# Patient Record
Sex: Female | Born: 1975 | Race: White | Hispanic: No | Marital: Married | Smoking: Never smoker
Health system: Southern US, Community
[De-identification: ages and names within clinical notes are randomized; demographics above are authoritative.]

## PROBLEM LIST (undated history)

## (undated) DIAGNOSIS — Z9889 Other specified postprocedural states: Secondary | ICD-10-CM

## (undated) DIAGNOSIS — I10 Essential (primary) hypertension: Secondary | ICD-10-CM

## (undated) DIAGNOSIS — I639 Cerebral infarction, unspecified: Secondary | ICD-10-CM

## (undated) DIAGNOSIS — I6381 Other cerebral infarction due to occlusion or stenosis of small artery: Secondary | ICD-10-CM

## (undated) DIAGNOSIS — K589 Irritable bowel syndrome without diarrhea: Secondary | ICD-10-CM

## (undated) DIAGNOSIS — J45909 Unspecified asthma, uncomplicated: Secondary | ICD-10-CM

## (undated) DIAGNOSIS — G43909 Migraine, unspecified, not intractable, without status migrainosus: Secondary | ICD-10-CM

## (undated) DIAGNOSIS — R112 Nausea with vomiting, unspecified: Secondary | ICD-10-CM

## (undated) HISTORY — DX: Cerebral infarction, unspecified: I63.9

## (undated) HISTORY — DX: Migraine, unspecified, not intractable, without status migrainosus: G43.909

## (undated) HISTORY — PX: TUBAL LIGATION: SHX77

## (undated) HISTORY — PX: CHOLECYSTECTOMY: SHX55

## (undated) HISTORY — PX: HERNIA REPAIR: SHX51

## (undated) HISTORY — DX: Other cerebral infarction due to occlusion or stenosis of small artery: I63.81

---
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
## 2000-05-09 ENCOUNTER — Other Ambulatory Visit: Admission: RE | Admit: 2000-05-09 | Discharge: 2000-05-09 | Payer: Self-pay | Admitting: Obstetrics

## 2000-07-15 ENCOUNTER — Inpatient Hospital Stay (HOSPITAL_COMMUNITY): Admission: AD | Admit: 2000-07-15 | Discharge: 2000-07-15 | Payer: Self-pay | Admitting: Obstetrics

## 2000-08-30 ENCOUNTER — Inpatient Hospital Stay (HOSPITAL_COMMUNITY): Admission: AD | Admit: 2000-08-30 | Discharge: 2000-08-30 | Payer: Self-pay | Admitting: Obstetrics

## 2000-09-17 ENCOUNTER — Inpatient Hospital Stay (HOSPITAL_COMMUNITY): Admission: AD | Admit: 2000-09-17 | Discharge: 2000-09-17 | Payer: Self-pay | Admitting: Obstetrics

## 2000-10-12 ENCOUNTER — Inpatient Hospital Stay (HOSPITAL_COMMUNITY): Admission: AD | Admit: 2000-10-12 | Discharge: 2000-10-12 | Payer: Self-pay | Admitting: Obstetrics

## 2000-10-18 ENCOUNTER — Inpatient Hospital Stay (HOSPITAL_COMMUNITY): Admission: AD | Admit: 2000-10-18 | Discharge: 2000-10-18 | Payer: Self-pay | Admitting: Obstetrics

## 2000-10-19 ENCOUNTER — Ambulatory Visit (HOSPITAL_COMMUNITY): Admission: RE | Admit: 2000-10-19 | Discharge: 2000-10-19 | Payer: Self-pay | Admitting: Obstetrics

## 2000-10-19 ENCOUNTER — Encounter: Payer: Self-pay | Admitting: Obstetrics

## 2000-11-12 ENCOUNTER — Inpatient Hospital Stay (HOSPITAL_COMMUNITY): Admission: AD | Admit: 2000-11-12 | Discharge: 2000-11-16 | Payer: Self-pay | Admitting: Obstetrics

## 2002-08-14 HISTORY — PX: HERNIA REPAIR: SHX51

## 2021-05-20 DIAGNOSIS — M25551 Pain in right hip: Secondary | ICD-10-CM | POA: Diagnosis not present

## 2021-05-20 DIAGNOSIS — M129 Arthropathy, unspecified: Secondary | ICD-10-CM | POA: Diagnosis not present

## 2021-05-20 DIAGNOSIS — Z0000 Encounter for general adult medical examination without abnormal findings: Secondary | ICD-10-CM | POA: Diagnosis not present

## 2021-05-20 DIAGNOSIS — R5383 Other fatigue: Secondary | ICD-10-CM | POA: Diagnosis not present

## 2021-05-20 DIAGNOSIS — G8929 Other chronic pain: Secondary | ICD-10-CM | POA: Diagnosis not present

## 2021-05-20 DIAGNOSIS — I10 Essential (primary) hypertension: Secondary | ICD-10-CM | POA: Diagnosis not present

## 2021-06-25 DIAGNOSIS — E7800 Pure hypercholesterolemia, unspecified: Secondary | ICD-10-CM | POA: Diagnosis not present

## 2021-06-25 DIAGNOSIS — I10 Essential (primary) hypertension: Secondary | ICD-10-CM | POA: Diagnosis not present

## 2021-06-25 DIAGNOSIS — Z79899 Other long term (current) drug therapy: Secondary | ICD-10-CM | POA: Diagnosis not present

## 2021-06-25 DIAGNOSIS — E559 Vitamin D deficiency, unspecified: Secondary | ICD-10-CM | POA: Diagnosis not present

## 2021-06-25 DIAGNOSIS — Z6836 Body mass index (BMI) 36.0-36.9, adult: Secondary | ICD-10-CM | POA: Diagnosis not present

## 2021-06-25 DIAGNOSIS — M25551 Pain in right hip: Secondary | ICD-10-CM | POA: Diagnosis not present

## 2021-06-29 DIAGNOSIS — Z79899 Other long term (current) drug therapy: Secondary | ICD-10-CM | POA: Diagnosis not present

## 2021-08-18 DIAGNOSIS — E559 Vitamin D deficiency, unspecified: Secondary | ICD-10-CM

## 2021-08-18 DIAGNOSIS — F418 Other specified anxiety disorders: Secondary | ICD-10-CM | POA: Insufficient documentation

## 2021-08-18 DIAGNOSIS — I10 Essential (primary) hypertension: Secondary | ICD-10-CM

## 2021-08-18 DIAGNOSIS — F419 Anxiety disorder, unspecified: Secondary | ICD-10-CM | POA: Insufficient documentation

## 2021-08-18 DIAGNOSIS — J45909 Unspecified asthma, uncomplicated: Secondary | ICD-10-CM | POA: Insufficient documentation

## 2021-08-18 HISTORY — DX: Essential (primary) hypertension: I10

## 2021-08-18 HISTORY — DX: Vitamin D deficiency, unspecified: E55.9

## 2021-08-18 HISTORY — DX: Other specified anxiety disorders: F41.8

## 2021-08-19 ENCOUNTER — Other Ambulatory Visit: Payer: Self-pay | Admitting: Physician Assistant

## 2021-08-19 DIAGNOSIS — Z9889 Other specified postprocedural states: Secondary | ICD-10-CM

## 2021-10-21 ENCOUNTER — Ambulatory Visit
Admission: RE | Admit: 2021-10-21 | Discharge: 2021-10-21 | Disposition: A | Discharge status: Home / Self Care | Payer: BC Managed Care – PPO | Source: Ambulatory Visit | Attending: Physician Assistant | Admitting: Physician Assistant

## 2021-10-21 DIAGNOSIS — R1030 Lower abdominal pain, unspecified: Secondary | ICD-10-CM | POA: Diagnosis not present

## 2021-10-21 DIAGNOSIS — Z9889 Other specified postprocedural states: Secondary | ICD-10-CM

## 2021-10-21 DIAGNOSIS — I700 Atherosclerosis of aorta: Secondary | ICD-10-CM | POA: Diagnosis not present

## 2021-11-16 DIAGNOSIS — R1031 Right lower quadrant pain: Secondary | ICD-10-CM | POA: Diagnosis not present

## 2021-11-23 DIAGNOSIS — N854 Malposition of uterus: Secondary | ICD-10-CM | POA: Diagnosis not present

## 2021-11-23 DIAGNOSIS — Z888 Allergy status to other drugs, medicaments and biological substances status: Secondary | ICD-10-CM | POA: Diagnosis not present

## 2021-11-23 DIAGNOSIS — Z87891 Personal history of nicotine dependence: Secondary | ICD-10-CM | POA: Diagnosis not present

## 2021-11-23 DIAGNOSIS — N83202 Unspecified ovarian cyst, left side: Secondary | ICD-10-CM | POA: Diagnosis not present

## 2021-11-23 DIAGNOSIS — R1031 Right lower quadrant pain: Secondary | ICD-10-CM | POA: Diagnosis not present

## 2021-11-23 DIAGNOSIS — N838 Other noninflammatory disorders of ovary, fallopian tube and broad ligament: Secondary | ICD-10-CM | POA: Diagnosis not present

## 2021-11-23 DIAGNOSIS — R102 Pelvic and perineal pain: Secondary | ICD-10-CM | POA: Diagnosis not present

## 2021-11-23 DIAGNOSIS — Z881 Allergy status to other antibiotic agents status: Secondary | ICD-10-CM | POA: Diagnosis not present

## 2021-11-24 DIAGNOSIS — R102 Pelvic and perineal pain: Secondary | ICD-10-CM | POA: Diagnosis not present

## 2021-11-24 DIAGNOSIS — R1900 Intra-abdominal and pelvic swelling, mass and lump, unspecified site: Secondary | ICD-10-CM | POA: Diagnosis not present

## 2021-12-12 DIAGNOSIS — N838 Other noninflammatory disorders of ovary, fallopian tube and broad ligament: Secondary | ICD-10-CM | POA: Diagnosis not present

## 2021-12-12 DIAGNOSIS — R1900 Intra-abdominal and pelvic swelling, mass and lump, unspecified site: Secondary | ICD-10-CM | POA: Diagnosis not present

## 2021-12-28 ENCOUNTER — Ambulatory Visit: Payer: Self-pay | Admitting: Surgery

## 2021-12-28 DIAGNOSIS — Z6835 Body mass index (BMI) 35.0-35.9, adult: Secondary | ICD-10-CM

## 2021-12-28 DIAGNOSIS — R1031 Right lower quadrant pain: Secondary | ICD-10-CM | POA: Diagnosis not present

## 2021-12-28 HISTORY — DX: Right lower quadrant pain: R10.31

## 2021-12-28 HISTORY — DX: Body mass index (BMI) 35.0-35.9, adult: Z68.35

## 2022-01-10 DIAGNOSIS — N83202 Unspecified ovarian cyst, left side: Secondary | ICD-10-CM | POA: Diagnosis not present

## 2022-01-10 DIAGNOSIS — N9489 Other specified conditions associated with female genital organs and menstrual cycle: Secondary | ICD-10-CM | POA: Diagnosis not present

## 2022-01-31 ENCOUNTER — Ambulatory Visit: Payer: Self-pay | Admitting: Surgery

## 2022-02-15 ENCOUNTER — Other Ambulatory Visit: Payer: Self-pay

## 2022-02-15 ENCOUNTER — Encounter (HOSPITAL_BASED_OUTPATIENT_CLINIC_OR_DEPARTMENT_OTHER): Payer: Self-pay | Admitting: Surgery

## 2022-02-24 ENCOUNTER — Ambulatory Visit (HOSPITAL_BASED_OUTPATIENT_CLINIC_OR_DEPARTMENT_OTHER): Payer: BC Managed Care – PPO | Admitting: Anesthesiology

## 2022-02-24 ENCOUNTER — Encounter (HOSPITAL_BASED_OUTPATIENT_CLINIC_OR_DEPARTMENT_OTHER): Payer: Self-pay | Admitting: Surgery

## 2022-02-24 ENCOUNTER — Other Ambulatory Visit: Payer: Self-pay

## 2022-02-24 ENCOUNTER — Encounter (HOSPITAL_BASED_OUTPATIENT_CLINIC_OR_DEPARTMENT_OTHER)
Admission: RE | Disposition: A | Discharge status: Home / Self Care | Payer: Self-pay | Source: Home / Self Care | Attending: Surgery

## 2022-02-24 ENCOUNTER — Ambulatory Visit (HOSPITAL_BASED_OUTPATIENT_CLINIC_OR_DEPARTMENT_OTHER)
Admission: RE | Admit: 2022-02-24 | Discharge: 2022-02-24 | Disposition: A | Discharge status: Home / Self Care | Payer: BC Managed Care – PPO | Attending: Surgery | Admitting: Surgery

## 2022-02-24 DIAGNOSIS — J45909 Unspecified asthma, uncomplicated: Secondary | ICD-10-CM | POA: Diagnosis not present

## 2022-02-24 DIAGNOSIS — Z01818 Encounter for other preprocedural examination: Secondary | ICD-10-CM

## 2022-02-24 DIAGNOSIS — R1031 Right lower quadrant pain: Secondary | ICD-10-CM | POA: Diagnosis not present

## 2022-02-24 DIAGNOSIS — Z87891 Personal history of nicotine dependence: Secondary | ICD-10-CM | POA: Diagnosis not present

## 2022-02-24 DIAGNOSIS — G8929 Other chronic pain: Secondary | ICD-10-CM | POA: Diagnosis not present

## 2022-02-24 DIAGNOSIS — K4090 Unilateral inguinal hernia, without obstruction or gangrene, not specified as recurrent: Secondary | ICD-10-CM | POA: Insufficient documentation

## 2022-02-24 DIAGNOSIS — K4190 Unilateral femoral hernia, without obstruction or gangrene, not specified as recurrent: Secondary | ICD-10-CM | POA: Diagnosis not present

## 2022-02-24 DIAGNOSIS — R413 Other amnesia: Secondary | ICD-10-CM | POA: Insufficient documentation

## 2022-02-24 DIAGNOSIS — Z79899 Other long term (current) drug therapy: Secondary | ICD-10-CM | POA: Diagnosis not present

## 2022-02-24 DIAGNOSIS — I10 Essential (primary) hypertension: Secondary | ICD-10-CM | POA: Insufficient documentation

## 2022-02-24 DIAGNOSIS — R4189 Other symptoms and signs involving cognitive functions and awareness: Secondary | ICD-10-CM | POA: Insufficient documentation

## 2022-02-24 DIAGNOSIS — R102 Pelvic and perineal pain: Secondary | ICD-10-CM | POA: Diagnosis not present

## 2022-02-24 DIAGNOSIS — R1030 Lower abdominal pain, unspecified: Secondary | ICD-10-CM | POA: Diagnosis not present

## 2022-02-24 HISTORY — PX: LAPAROSCOPY: SHX197

## 2022-02-24 HISTORY — DX: Nausea with vomiting, unspecified: R11.2

## 2022-02-24 HISTORY — DX: Essential (primary) hypertension: I10

## 2022-02-24 HISTORY — DX: Other amnesia: R41.3

## 2022-02-24 HISTORY — PX: INGUINAL HERNIA REPAIR: SHX194

## 2022-02-24 HISTORY — DX: Irritable bowel syndrome, unspecified: K58.9

## 2022-02-24 HISTORY — DX: Other specified postprocedural states: Z98.890

## 2022-02-24 HISTORY — DX: Unspecified asthma, uncomplicated: J45.909

## 2022-02-24 LAB — POCT I-STAT, CHEM 8
BUN: 13 mg/dL (ref 6–20)
Calcium, Ion: 1.3 mmol/L (ref 1.15–1.40)
Chloride: 97 mmol/L — ABNORMAL LOW (ref 98–111)
Creatinine, Ser: 0.4 mg/dL — ABNORMAL LOW (ref 0.44–1.00)
Glucose, Bld: 134 mg/dL — ABNORMAL HIGH (ref 70–99)
HCT: 44 % (ref 36.0–46.0)
Hemoglobin: 15 g/dL (ref 12.0–15.0)
Potassium: 3.3 mmol/L — ABNORMAL LOW (ref 3.5–5.1)
Sodium: 139 mmol/L (ref 135–145)
TCO2: 25 mmol/L (ref 22–32)

## 2022-02-24 LAB — POCT PREGNANCY, URINE: Preg Test, Ur: NEGATIVE

## 2022-02-24 SURGERY — LAPAROSCOPY, DIAGNOSTIC
Anesthesia: General | Site: Abdomen

## 2022-02-24 MED ORDER — BUPIVACAINE LIPOSOME 1.3 % IJ SUSP
INTRAMUSCULAR | Status: DC | PRN
  Administered 2022-02-24: 20 mL

## 2022-02-24 MED ORDER — OXYCODONE HCL 5 MG PO TABS
5.0000 mg | ORAL_TABLET | Freq: Once | ORAL | Status: AC | PRN
  Administered 2022-02-24: 5 mg via ORAL

## 2022-02-24 MED ORDER — PHENYLEPHRINE 80 MCG/ML (10ML) SYRINGE FOR IV PUSH (FOR BLOOD PRESSURE SUPPORT)
PREFILLED_SYRINGE | INTRAVENOUS | Status: AC
  Filled 2022-02-24: qty 10

## 2022-02-24 MED ORDER — CEFAZOLIN SODIUM-DEXTROSE 2-4 GM/100ML-% IV SOLN
INTRAVENOUS | Status: AC
  Filled 2022-02-24: qty 100

## 2022-02-24 MED ORDER — SODIUM CHLORIDE 0.9 % IV SOLN: 250.0000 mL | INTRAVENOUS | Status: DC | PRN

## 2022-02-24 MED ORDER — PROPOFOL 10 MG/ML IV BOLUS
INTRAVENOUS | Status: AC
  Filled 2022-02-24: qty 20

## 2022-02-24 MED ORDER — SCOPOLAMINE 1 MG/3DAYS TD PT72
MEDICATED_PATCH | TRANSDERMAL | Status: AC
  Filled 2022-02-24: qty 1

## 2022-02-24 MED ORDER — HYDROMORPHONE HCL 1 MG/ML IJ SOLN
0.2500 mg | INTRAMUSCULAR | Status: DC | PRN
  Administered 2022-02-24 (×3): 0.25 mg via INTRAVENOUS

## 2022-02-24 MED ORDER — OXYCODONE HCL 5 MG/5ML PO SOLN: 5.0000 mg | Freq: Once | ORAL | Status: AC | PRN

## 2022-02-24 MED ORDER — DEXAMETHASONE SODIUM PHOSPHATE 10 MG/ML IJ SOLN
INTRAMUSCULAR | Status: AC
  Filled 2022-02-24: qty 1

## 2022-02-24 MED ORDER — 0.9 % SODIUM CHLORIDE (POUR BTL) OPTIME
TOPICAL | Status: DC | PRN
  Administered 2022-02-24: 2000 mL

## 2022-02-24 MED ORDER — CEFAZOLIN SODIUM-DEXTROSE 2-4 GM/100ML-% IV SOLN
2.0000 g | INTRAVENOUS | Status: AC
  Administered 2022-02-24: 2 g via INTRAVENOUS

## 2022-02-24 MED ORDER — SCOPOLAMINE 1 MG/3DAYS TD PT72
1.0000 | MEDICATED_PATCH | TRANSDERMAL | Status: DC
Start: 2022-02-24 — End: 2022-02-24
  Administered 2022-02-24: 1.5 mg via TRANSDERMAL

## 2022-02-24 MED ORDER — LIDOCAINE HCL (PF) 2 % IJ SOLN
INTRAMUSCULAR | Status: AC
  Filled 2022-02-24: qty 5

## 2022-02-24 MED ORDER — ACETAMINOPHEN 500 MG PO TABS
1000.0000 mg | ORAL_TABLET | ORAL | Status: AC
  Administered 2022-02-24: 1000 mg via ORAL

## 2022-02-24 MED ORDER — HYDROMORPHONE HCL 1 MG/ML IJ SOLN
INTRAMUSCULAR | Status: AC
  Filled 2022-02-24: qty 1

## 2022-02-24 MED ORDER — ROCURONIUM BROMIDE 10 MG/ML (PF) SYRINGE
PREFILLED_SYRINGE | INTRAVENOUS | Status: AC
  Filled 2022-02-24: qty 10

## 2022-02-24 MED ORDER — MIDAZOLAM HCL 5 MG/5ML IJ SOLN
INTRAMUSCULAR | Status: DC | PRN
  Administered 2022-02-24 (×2): 1 mg via INTRAVENOUS

## 2022-02-24 MED ORDER — SODIUM CHLORIDE 0.9% FLUSH: 3.0000 mL | INTRAVENOUS | Status: DC | PRN

## 2022-02-24 MED ORDER — BUPIVACAINE LIPOSOME 1.3 % IJ SUSP: 20.0000 mL | Freq: Once | INTRAMUSCULAR | Status: DC

## 2022-02-24 MED ORDER — SUGAMMADEX SODIUM 200 MG/2ML IV SOLN
INTRAVENOUS | Status: DC | PRN
  Administered 2022-02-24: 200 mg via INTRAVENOUS

## 2022-02-24 MED ORDER — CHLORHEXIDINE GLUCONATE CLOTH 2 % EX PADS: 6.0000 | MEDICATED_PAD | Freq: Once | CUTANEOUS | Status: DC

## 2022-02-24 MED ORDER — AMISULPRIDE (ANTIEMETIC) 5 MG/2ML IV SOLN: 10.0000 mg | Freq: Once | INTRAVENOUS | Status: DC | PRN

## 2022-02-24 MED ORDER — FENTANYL CITRATE (PF) 250 MCG/5ML IJ SOLN
INTRAMUSCULAR | Status: AC
  Filled 2022-02-24: qty 5

## 2022-02-24 MED ORDER — LIDOCAINE HCL (CARDIAC) PF 100 MG/5ML IV SOSY
PREFILLED_SYRINGE | INTRAVENOUS | Status: DC | PRN
  Administered 2022-02-24: 100 mg via INTRAVENOUS

## 2022-02-24 MED ORDER — ACETAMINOPHEN 500 MG PO TABS
ORAL_TABLET | ORAL | Status: AC
  Filled 2022-02-24: qty 2

## 2022-02-24 MED ORDER — DEXAMETHASONE SODIUM PHOSPHATE 10 MG/ML IJ SOLN
INTRAMUSCULAR | Status: DC | PRN
  Administered 2022-02-24: 4 mg via INTRAVENOUS

## 2022-02-24 MED ORDER — TRAMADOL HCL 50 MG PO TABS: 50.0000 mg | ORAL_TABLET | Freq: Four times a day (QID) | ORAL | 0 refills | Status: DC | PRN

## 2022-02-24 MED ORDER — MEPERIDINE HCL 25 MG/ML IJ SOLN: 6.2500 mg | INTRAMUSCULAR | Status: DC | PRN

## 2022-02-24 MED ORDER — CELECOXIB 200 MG PO CAPS
ORAL_CAPSULE | ORAL | Status: AC
  Filled 2022-02-24: qty 1

## 2022-02-24 MED ORDER — PROPOFOL 10 MG/ML IV BOLUS
INTRAVENOUS | Status: DC | PRN
  Administered 2022-02-24: 150 mg via INTRAVENOUS

## 2022-02-24 MED ORDER — ONDANSETRON HCL 4 MG/2ML IJ SOLN
INTRAMUSCULAR | Status: AC
  Filled 2022-02-24: qty 2

## 2022-02-24 MED ORDER — FENTANYL CITRATE (PF) 100 MCG/2ML IJ SOLN
INTRAMUSCULAR | Status: DC | PRN
  Administered 2022-02-24 (×2): 25 ug via INTRAVENOUS
  Administered 2022-02-24 (×2): 50 ug via INTRAVENOUS
  Administered 2022-02-24: 25 ug via INTRAVENOUS

## 2022-02-24 MED ORDER — CELECOXIB 200 MG PO CAPS
200.0000 mg | ORAL_CAPSULE | ORAL | Status: AC
  Administered 2022-02-24: 200 mg via ORAL

## 2022-02-24 MED ORDER — PROMETHAZINE HCL 25 MG/ML IJ SOLN: 6.2500 mg | INTRAMUSCULAR | Status: DC | PRN

## 2022-02-24 MED ORDER — ONDANSETRON HCL 4 MG/2ML IJ SOLN
INTRAMUSCULAR | Status: DC | PRN
  Administered 2022-02-24: 4 mg via INTRAVENOUS

## 2022-02-24 MED ORDER — OXYCODONE HCL 5 MG PO TABS
ORAL_TABLET | ORAL | Status: AC
  Filled 2022-02-24: qty 1

## 2022-02-24 MED ORDER — MIDAZOLAM HCL 2 MG/2ML IJ SOLN
INTRAMUSCULAR | Status: AC
  Filled 2022-02-24: qty 2

## 2022-02-24 MED ORDER — LACTATED RINGERS IV SOLN: INTRAVENOUS | Status: DC

## 2022-02-24 MED ORDER — ROCURONIUM BROMIDE 100 MG/10ML IV SOLN
INTRAVENOUS | Status: DC | PRN
  Administered 2022-02-24: 100 mg via INTRAVENOUS
  Administered 2022-02-24 (×2): 20 mg via INTRAVENOUS

## 2022-02-24 MED ORDER — BUPIVACAINE-EPINEPHRINE 0.25% -1:200000 IJ SOLN
INTRAMUSCULAR | Status: DC | PRN
  Administered 2022-02-24: 60 mL

## 2022-02-24 MED ORDER — SODIUM CHLORIDE 0.9 % IR SOLN
Status: DC | PRN
  Administered 2022-02-24: 1000 mL

## 2022-02-24 MED ORDER — METHOCARBAMOL 750 MG PO TABS
750.0000 mg | ORAL_TABLET | Freq: Four times a day (QID) | ORAL | 2 refills | Status: DC | PRN
Start: 2022-02-24 — End: 2022-04-10

## 2022-02-24 MED ORDER — SODIUM CHLORIDE 0.9% FLUSH: 3.0000 mL | Freq: Two times a day (BID) | INTRAVENOUS | Status: DC

## 2022-02-24 MED ORDER — PHENYLEPHRINE HCL (PRESSORS) 10 MG/ML IV SOLN
INTRAVENOUS | Status: DC | PRN
  Administered 2022-02-24: 80 ug via INTRAVENOUS

## 2022-02-24 MED ORDER — ENSURE PRE-SURGERY PO LIQD: 296.0000 mL | Freq: Once | ORAL | Status: DC

## 2022-02-24 SURGICAL SUPPLY — 59 items
APL PRP STRL LF DISP 70% ISPRP (MISCELLANEOUS) ×4
APPLIER CLIP LOGIC TI 5 (MISCELLANEOUS) ×1 IMPLANT
APR CLP MED LRG 33X5 (MISCELLANEOUS) ×2
BLADE SURG 15 STRL LF DISP TIS (BLADE) ×2 IMPLANT
BLADE SURG 15 STRL SS (BLADE) ×3
CABLE HIGH FREQUENCY MONO STRZ (ELECTRODE) ×3 IMPLANT
CHLORAPREP W/TINT 26 (MISCELLANEOUS) ×4 IMPLANT
DECANTER SPIKE VIAL GLASS SM (MISCELLANEOUS) ×3 IMPLANT
DEVICE SECURE STRAP 25 ABSORB (INSTRUMENTS) IMPLANT
DRAPE LAPAROSCOPIC ABDOMINAL (DRAPES) ×3 IMPLANT
DRAPE WARM FLUID 44X44 (DRAPES) ×3 IMPLANT
DRSG TEGADERM 2-3/8X2-3/4 SM (GAUZE/BANDAGES/DRESSINGS) ×6 IMPLANT
DRSG TEGADERM 4X4.75 (GAUZE/BANDAGES/DRESSINGS) ×3 IMPLANT
ELECT REM PT RETURN 9FT ADLT (ELECTROSURGICAL) ×3
ELECTRODE REM PT RTRN 9FT ADLT (ELECTROSURGICAL) ×2 IMPLANT
GAUZE 4X4 16PLY ~~LOC~~+RFID DBL (SPONGE) ×3 IMPLANT
GAUZE SPONGE 4X4 12PLY STRL (GAUZE/BANDAGES/DRESSINGS) ×3 IMPLANT
GLOVE BIO SURGEON STRL SZ7.5 (GLOVE) ×1 IMPLANT
GLOVE BIOGEL PI IND STRL 7.0 (GLOVE) IMPLANT
GLOVE BIOGEL PI IND STRL 8 (GLOVE) ×2 IMPLANT
GLOVE BIOGEL PI INDICATOR 7.0 (GLOVE) ×3
GLOVE BIOGEL PI INDICATOR 8 (GLOVE) ×1
GLOVE ECLIPSE 6.5 STRL STRAW (GLOVE) ×1 IMPLANT
GLOVE ECLIPSE 8.0 STRL XLNG CF (GLOVE) ×3 IMPLANT
GOWN STRL REUS W/TWL LRG LVL3 (GOWN DISPOSABLE) ×2 IMPLANT
GOWN STRL REUS W/TWL XL LVL3 (GOWN DISPOSABLE) ×6 IMPLANT
IRRIG SUCT STRYKERFLOW 2 WTIP (MISCELLANEOUS) ×3
IRRIGATION SUCT STRKRFLW 2 WTP (MISCELLANEOUS) IMPLANT
KIT TURNOVER CYSTO (KITS) ×3 IMPLANT
MANIFOLD NEPTUNE II (INSTRUMENTS) ×1 IMPLANT
MESH BARD SOFT 6X6IN (Mesh General) ×3 IMPLANT
NEEDLE HYPO 22GX1.5 SAFETY (NEEDLE) ×3 IMPLANT
NS IRRIG 500ML POUR BTL (IV SOLUTION) ×3 IMPLANT
PACK BASIN DAY SURGERY FS (CUSTOM PROCEDURE TRAY) ×3 IMPLANT
PAD POSITIONING PINK XL (MISCELLANEOUS) ×3 IMPLANT
PENCIL SMOKE EVACUATOR (MISCELLANEOUS) ×3 IMPLANT
SCISSORS LAP 5X35 DISP (ENDOMECHANICALS) ×3 IMPLANT
SET TUBE SMOKE EVAC HIGH FLOW (TUBING) ×3 IMPLANT
SLEEVE ADV FIXATION 5X100MM (TROCAR) ×4 IMPLANT
SPONGE GAUZE 2X2 8PLY STRL LF (GAUZE/BANDAGES/DRESSINGS) ×9 IMPLANT
SPONGE T-LAP 18X18 ~~LOC~~+RFID (SPONGE) ×3 IMPLANT
SPONGE T-LAP 4X18 ~~LOC~~+RFID (SPONGE) ×2 IMPLANT
SUT MNCRL AB 4-0 PS2 18 (SUTURE) ×3 IMPLANT
SUT PDS AB 1 CT1 27 (SUTURE) ×1 IMPLANT
SUT PROLENE 0 CT 1 30 (SUTURE) ×4 IMPLANT
SUT PROLENE 0 CT 1 CR/8 (SUTURE) ×3 IMPLANT
SUT VIC AB 2-0 SH 27 (SUTURE) ×3
SUT VIC AB 2-0 SH 27XBRD (SUTURE) IMPLANT
SUT VICRYL 0 UR6 27IN ABS (SUTURE) ×1 IMPLANT
SUT VLOC BARB 180 ABS3/0GR12 (SUTURE) ×3
SUTURE VLOC BRB 180 ABS3/0GR12 (SUTURE) IMPLANT
SYR 20ML LL LF (SYRINGE) ×3 IMPLANT
SYR BULB IRRIG 60ML STRL (SYRINGE) ×3 IMPLANT
TOWEL OR 17X26 10 PK STRL BLUE (TOWEL DISPOSABLE) ×6 IMPLANT
TRAY DSU PREP LF (CUSTOM PROCEDURE TRAY) ×3 IMPLANT
TRAY LAPAROSCOPIC (CUSTOM PROCEDURE TRAY) ×3 IMPLANT
TROCAR ADV FIXATION 5X100MM (TROCAR) ×3 IMPLANT
TROCAR XCEL BLUNT TIP 100MML (ENDOMECHANICALS) ×3 IMPLANT
YANKAUER SUCT BULB TIP NO VENT (SUCTIONS) ×3 IMPLANT

## 2022-02-24 NOTE — Discharge Instructions (Addendum)
HERNIA REPAIR: POST OP INSTRUCTIONS  ######################################################################  EAT Gradually transition to a high fiber diet with a fiber supplement over the next few weeks after discharge.  Start with a pureed / full liquid diet (see below)  WALK Walk an hour a day.  Control your pain to do that.    CONTROL PAIN Control pain so that you can walk, sleep, tolerate sneezing/coughing, and go up/down stairs.  HAVE A BOWEL MOVEMENT DAILY Keep your bowels regular to avoid problems.  OK to try a laxative to override constipation.  OK to use an antidairrheal to slow down diarrhea.  Call if not better after 2 tries  CALL IF YOU HAVE PROBLEMS/CONCERNS Call if you are still struggling despite following these instructions. Call if you have concerns not answered by these instructions  ######################################################################    DIET: Follow a light bland diet & liquids the first 24 hours after arrival home, such as soup, liquids, starches, etc.  Be sure to drink plenty of fluids.  Quickly advance to a usual solid diet within a few days.  Avoid fast food or heavy meals as your are more likely to get nauseated or have irregular bowels.  A low-fat, high-fiber diet for the rest of your life is ideal.   Take your usually prescribed home medications unless otherwise directed.  PAIN CONTROL: Pain is best controlled by a usual combination of three different methods TOGETHER: Ice/Heat Over the counter pain medication Prescription pain medication Most patients will experience some swelling and bruising around the hernia(s) such as the bellybutton, groins, or old incisions.  Ice packs or heating pads (30-60 minutes up to 6 times a day) will help. Use ice for the first few days to help decrease swelling and bruising, then switch to heat to help relax tight/sore spots and speed recovery.  Some people prefer to use ice alone, heat alone, alternating  between ice & heat.  Experiment to what works for you.  Swelling and bruising can take several weeks to resolve.   It is helpful to take an over-the-counter pain medication regularly for the first few weeks.  Choose one of the following that works best for you: Naproxen (Aleve, etc)  Two 244m tabs twice a day Ibuprofen (Advil, etc) Three 2045mtabs four times a day (every meal & bedtime) Acetaminophen (Tylenol, etc) 325-65043mour times a day (every meal & bedtime) A  prescription for pain medication should be given to you upon discharge.  Take your pain medication as prescribed.  If you are having problems/concerns with the prescription medicine (does not control pain, nausea, vomiting, rash, itching, etc), please call us [phone] see if we need to switch you to a different pain medicine that will work better for you and/or control your side effect better. If you need a refill on your pain medication, please contact your pharmacy.  They will contact our office to request authorization. Prescriptions will not be filled after 5 pm or on week-ends.  Avoid getting constipated.  Between the surgery and the pain medications, it is common to experience some constipation.  Increasing fluid intake and taking a fiber supplement (such as Metamucil, Citrucel, FiberCon, MiraLax, etc) 1-2 times a day regularly will usually help prevent this problem from occurring.  A mild laxative (prune juice, Milk of Magnesia, MiraLax, etc) should be taken according to package directions if there are no bowel movements after 48 hours.    Wash / shower every day.  You may shower over the dressings  as they are waterproof.    It is good for closed incisions and even open wounds to be washed every day.  Shower every day.  Short baths are fine.  Wash the incisions and wounds clean with soap & water.    You may leave closed incisions open to air if it is dry.   You may cover the incision with clean gauze & replace it after  your daily shower for comfort.  TEGADERM:  You have clear gauze band-aid dressings over your closed incision(s).  Remove the dressings 3 days after surgery.= Monday 7/17    ACTIVITIES as tolerated:   You may resume regular (light) daily activities beginning the next day--such as daily self-care, walking, climbing stairs--gradually increasing activities as tolerated.  Control your pain so that you can walk an hour a day.  If you can walk 30 minutes without difficulty, it is safe to try more intense activity such as jogging, treadmill, bicycling, low-impact aerobics, swimming, etc. Save the most intensive and strenuous activity for last such as sit-ups, heavy lifting, contact sports, etc  Refrain from any heavy lifting or straining until you are off narcotics for pain control.   DO NOT PUSH THROUGH PAIN.  Let pain be your guide: If it hurts to do something, don't do it.  Pain is your body warning you to avoid that activity for another week until the pain goes down. You may drive when you are no longer taking prescription pain medication, you can comfortably wear a seatbelt, and you can safely maneuver your car and apply brakes. You may have sexual intercourse when it is comfortable.   FOLLOW UP in our office Please call [facility] at [phone] to set up an appointment to see your surgeon in the office for a follow-up appointment approximately 2-3 weeks after your surgery. Make sure that you call for this appointment the day you arrive home to insure a convenient appointment time.  9.  If you have disability of FMLA / Family leave forms, please bring the forms to the office for processing.  (do not give to your surgeon).  WHEN TO CALL us [phone]: Poor pain control Reactions / problems with new medications (rash/itching, nausea, etc)  Fever over 101.5 F (38.5 C) Inability to urinate Nausea and/or vomiting Worsening swelling or bruising Continued bleeding from incision. Increased pain,  redness, or drainage from the incision   The clinic staff is available to answer your questions during regular business hours (8:30am-5pm).  Please don't hesitate to call and ask to speak to one of our nurses for clinical concerns.   If you have a medical emergency, go to the nearest emergency room or call 911.  A surgeon from [facility] is always on call at the hospitals in [facility], [place] [address], [address], [place], [place]  [address] ?  [address] [address], [place], [place]   [address] MAIN: [phone] ? TOLL FREE: [phone] ? FAX: [phone] [email or web address]   Information for Discharge Teaching: EXPAREL (bupivacaine liposome injectable suspension)   Your surgeon or anesthesiologist gave you EXPAREL(bupivacaine) to help control your pain after surgery.  EXPAREL is a local anesthetic that provides pain relief by numbing the tissue around the surgical site. EXPAREL is designed to release pain medication over time and can control pain for up to 72 hours. Depending on how you respond to EXPAREL, you may require less pain medication during your recovery.  Possible side effects: Temporary loss  of sensation or ability to move in the area where bupivacaine was injected. Nausea, vomiting, constipation Rarely, numbness and tingling in your mouth or lips, lightheadedness, or anxiety may occur. Call your doctor right away if you think you may be experiencing any of these sensations, or if you have other questions regarding possible side effects.  Follow all other discharge instructions given to you by your surgeon or nurse. Eat a healthy diet and drink plenty of water or other fluids.  If you return to the hospital for any reason within 96 hours following the administration of EXPAREL, it is important for health care providers to know that you have received this anesthetic. A teal colored band has been placed on your arm with the  date, time and amount of EXPAREL you have received in order to alert and inform your health care providers. Please leave this armband in place for the full 96 hours following administration, and then you may remove the band.   You can remove the armband on Tuesday July 18th.   Post Anesthesia Home Care Instructions  Activity: Get plenty of rest for the remainder of the day. A responsible individual must stay with you for 24 hours following the procedure.  For the next 24 hours, DO NOT: -Drive a car -Advertising copywriter -Drink alcoholic beverages -Take any medication unless instructed by your physician -Make any legal decisions or sign important papers.  Meals: Start with liquid foods such as gelatin or soup. Progress to regular foods as tolerated. Avoid greasy, spicy, heavy foods. If nausea and/or vomiting occur, drink only clear liquids until the nausea and/or vomiting subsides. Call your physician if vomiting continues.  Special Instructions/Symptoms: Your throat may feel dry or sore from the anesthesia or the breathing tube placed in your throat during surgery. If this causes discomfort, gargle with warm salt water. The discomfort should disappear within 24 hours.  No acetaminophen/Tylenol until after 2 pm today if needed.  No ibuprofen, Advil, Aleve, Motrin, ketorolac, meloxicam, naproxen, or other NSAIDS until after 2 pm today if needed.

## 2022-02-24 NOTE — Interval H&P Note (Signed)
History and Physical Interval Note:  02/24/2022 8:40 AM  Hannah Ray  has presented today for surgery, with the diagnosis of CHRONIC RIGHT GROIN PAIN. POSSIBLE RECURRENT RIGHT INGUINAL HERNIA VERSUS FEMORAL HERNIA.  The various methods of treatment have been discussed with the patient and family. After consideration of risks, benefits and other options for treatment, the patient has consented to  Procedure(s): LAPAROSCOPY DIAGNOSTIC (N/A) POSSIBLE OPEN RECURRENT RIGHT INGUINAL HERNIA REPAIR AND POSSIBLE TRIPLE NEURECTOMY (N/A) as a surgical intervention.  The patient's history has been reviewed, patient examined, no change in status, stable for surgery.  I have reviewed the patient's chart and labs.  Questions were answered to the patient's satisfaction.    I have re-reviewed the the patient's records, history, medications, and allergies.  I have re-examined the patient.  I again discussed intraoperative plans and goals of post-operative recovery.  The patient agrees to proceed.  Hannah Ray  22-Sep-1975 [id number]  Patient Care Team: Marrian Salvage, Cordelia Poche as PCP - General (Physician Assistant)  There are no problems to display for this patient.   Past Medical History:  Diagnosis Date   Asthma    Hypertension    IBS (irritable bowel syndrome)    PONV (postoperative nausea and vomiting)    Scope patch helps    Past Surgical History:  Procedure Laterality Date   CESAREAN SECTION  2002   w/ tubal ligation   CHOLECYSTECTOMY  1997   HERNIA REPAIR  2004    Social History   Socioeconomic History   Marital status: Married    Spouse name: Not on file   Number of children: Not on file   Years of education: Not on file   Highest education level: Not on file  Occupational History   Not on file  Tobacco Use   Smoking status: Never   Smokeless tobacco: Never  Vaping Use   Vaping Use: Every day   Substances: Nicotine  Substance and Sexual Activity   Alcohol use: Never   Drug  use: Never   Sexual activity: Not on file  Other Topics Concern   Not on file  Social History Narrative   Not on file   Social Determinants of Health   Financial Resource Strain: Not on file  Food Insecurity: Not on file  Transportation Needs: Not on file  Physical Activity: Not on file  Stress: Not on file  Social Connections: Not on file  Intimate Partner Violence: Not on file    History reviewed. No pertinent family history.  Medications Prior to Admission  Medication Sig Dispense Refill Last Dose   hydrochlorothiazide (HYDRODIURIL) 25 MG tablet Take 25 mg by mouth daily.   02/23/2022   NON FORMULARY Heather's tummy tamer + enzyme to help with IBS   02/23/2022   valsartan (DIOVAN) 40 MG tablet Take 40 mg by mouth daily.   02/23/2022   albuterol (VENTOLIN HFA) 108 (90 Base) MCG/ACT inhaler Inhale into the lungs every 6 (six) hours as needed for wheezing or shortness of breath.   More than a month    Current Facility-Administered Medications  Medication Dose Route Frequency Provider Last Rate Last Admin   bupivacaine liposome (EXPAREL) 1.3 % injection 266 mg  20 mL Infiltration Once Karie Soda, MD       ceFAZolin (ANCEF) IVPB 2g/100 mL premix  2 g Intravenous On Call to OR Karie Soda, MD       Chlorhexidine Gluconate Cloth 2 % PADS 6 each  6 each Topical  Once Karie Soda, MD       And   Chlorhexidine Gluconate Cloth 2 % PADS 6 each  6 each Topical Once Karie Soda, MD       [START ON 02/25/2022] feeding supplement (ENSURE PRE-SURGERY) liquid 296 mL  296 mL Oral Once Karie Soda, MD       lactated ringers infusion   Intravenous Continuous Lowella Curb, MD 10 mL/hr at 02/24/22 [phone] Continued from Pre-op at 02/24/22 0832   scopolamine (TRANSDERM-SCOP) 1 MG/3DAYS 1.5 mg  1 patch Transdermal Q72H Lowella Curb, MD   1.5 mg at 02/24/22 0816     Allergies  Allergen Reactions   Mobic [Meloxicam] Swelling    Leg swelling. OK to take ibuprofen/aleve   Norvasc  [Amlodipine] Swelling   Tamiflu [Oseltamivir] Hives   Tape Dermatitis    Clear transpore ok   Tetracycline Other (See Comments)    Breakthrough bleeding>    BP (!) 153/83   Pulse 71   Temp 98 F (36.7 C) (Oral)   Resp 18   Ht 5\' 8"  (1.727 m)   Wt 113.1 kg   LMP 02/11/2022   SpO2 98%   BMI 37.92 kg/m   Labs: Results for orders placed or performed during the hospital encounter of 02/24/22 (from the past 48 hour(s))  Pregnancy, urine POC     Status: None   Collection Time: 02/24/22  7:28 AM  Result Value Ref Range   Preg Test, Ur NEGATIVE NEGATIVE    Comment:        THE SENSITIVITY OF THIS METHODOLOGY IS >24 mIU/mL   I-STAT, chem 8     Status: Abnormal   Collection Time: 02/24/22  8:10 AM  Result Value Ref Range   Sodium 139 135 - 145 mmol/L   Potassium 3.3 (L) 3.5 - 5.1 mmol/L   Chloride 97 (L) 98 - 111 mmol/L   BUN 13 6 - 20 mg/dL   Creatinine, Ser 02/26/22 (L) 0.44 - 1.00 mg/dL   Glucose, Bld 3.97 (H) 70 - 99 mg/dL    Comment: Glucose reference range applies only to samples taken after fasting for at least 8 hours.   Calcium, Ion 1.30 1.15 - 1.40 mmol/L   TCO2 25 22 - 32 mmol/L   Hemoglobin 15.0 12.0 - 15.0 g/dL   HCT 673 41.9 - 37.9 %    Imaging / Studies: No results found.   02.4, M.D., F.A.C.S. Gastrointestinal and Minimally Invasive Surgery [facility] [address], [address] [place], [place] [phone] Main / Paging  02/24/2022 8:40 AM    02/26/2022

## 2022-02-24 NOTE — Anesthesia Postprocedure Evaluation (Signed)
Anesthesia Post Note  Patient: ALASHIA BROWNFIELD  Procedure(s) Performed: LAPAROSCOPY DIAGNOSTIC (Abdomen) LAPAROSCOPIC ABDOMINAL LYSIS OF ADHESIONS, RIGHT TRIPLE NEURECTOMY, RIGHT FEMORAL L HERNIA REPAIR, LEFT INGUINAL HERNIA  REPAIR WITH MESH TAP BLOCK (Bilateral: Abdomen)     Patient location during evaluation: PACU Anesthesia Type: General Level of consciousness: awake and alert Pain management: pain level controlled Vital Signs Assessment: post-procedure vital signs reviewed and stable Respiratory status: spontaneous breathing, nonlabored ventilation and respiratory function stable Cardiovascular status: blood pressure returned to baseline and stable Postop Assessment: no apparent nausea or vomiting Anesthetic complications: no   No notable events documented.  Last Vitals:  Vitals:   02/24/22 1300 02/24/22 1315  BP: (!) 144/63 134/69  Pulse: 79 85  Resp: 13 15  Temp:    SpO2: 95% 95%    Last Pain:  Vitals:   02/24/22 1300  TempSrc:   PainSc: 4                  Lowella Curb

## 2022-02-24 NOTE — Anesthesia Procedure Notes (Signed)
Procedure Name: Intubation Date/Time: 02/24/2022 9:41 AM  Performed by: Garrel Ridgel, CRNAPre-anesthesia Checklist: Patient identified, Emergency Drugs available, Suction available and Patient being monitored Patient Re-evaluated:Patient Re-evaluated prior to induction Oxygen Delivery Method: Circle system utilized Preoxygenation: Pre-oxygenation with 100% oxygen Induction Type: IV induction Ventilation: Mask ventilation without difficulty Laryngoscope Size: Mac and 3 Grade View: Grade II Tube type: Oral Tube size: 7.5 mm Number of attempts: 1 Airway Equipment and Method: Stylet and Oral airway Placement Confirmation: ETT inserted through vocal cords under direct vision, positive ETCO2 and breath sounds checked- equal and bilateral Secured at: 23 cm Tube secured with: Tape Dental Injury: Teeth and Oropharynx as per pre-operative assessment

## 2022-02-24 NOTE — Anesthesia Preprocedure Evaluation (Signed)
Anesthesia Evaluation  Patient identified by MRN, date of birth, ID band Patient awake    Reviewed: Allergy & Precautions, NPO status , Patient's Chart, lab work & pertinent test results  History of Anesthesia Complications (+) PONV and history of anesthetic complications  Airway Mallampati: II  TM Distance: >3 FB Neck ROM: Full    Dental no notable dental hx.    Pulmonary asthma , Patient abstained from smoking.,    Pulmonary exam normal breath sounds clear to auscultation       Cardiovascular hypertension, Pt. on medications negative cardio ROS Normal cardiovascular exam Rhythm:Regular Rate:Normal     Neuro/Psych negative neurological ROS  negative psych ROS   GI/Hepatic negative GI ROS, Neg liver ROS,   Endo/Other  negative endocrine ROS  Renal/GU negative Renal ROS  negative genitourinary   Musculoskeletal negative musculoskeletal ROS (+)   Abdominal (+) + obese,   Peds negative pediatric ROS (+)  Hematology negative hematology ROS (+)   Anesthesia Other Findings   Reproductive/Obstetrics negative OB ROS                             Anesthesia Physical Anesthesia Plan  ASA: 2  Anesthesia Plan: General   Post-op Pain Management: Tylenol PO (pre-op)*, Celebrex PO (pre-op)* and Dilaudid IV   Induction: Intravenous  PONV Risk Score and Plan: 4 or greater and Ondansetron, Dexamethasone, Midazolam, Scopolamine patch - Pre-op and Treatment may vary due to age or medical condition  Airway Management Planned: Oral ETT  Additional Equipment:   Intra-op Plan:   Post-operative Plan: Extubation in OR  Informed Consent: I have reviewed the patients History and Physical, chart, labs and discussed the procedure including the risks, benefits and alternatives for the proposed anesthesia with the patient or authorized representative who has indicated his/her understanding and acceptance.      Dental advisory given  Plan Discussed with: CRNA  Anesthesia Plan Comments:         Anesthesia Quick Evaluation

## 2022-02-24 NOTE — Progress Notes (Signed)
Note: Portions of this report may have been transcribed using voice recognition software. A sincere effort was made to ensure accuracy; however, inadvertent computerized transcription errors may be present.   Any transcriptional errors that result from this process are unintentional.        Hannah Ray  1975/11/08 [id number]  Patient Care Team: Marrian Salvage, Cordelia Poche as PCP - General (Physician Assistant) Karie Soda, MD as Consulting Physician (General Surgery) Lowella Curb, [place] as Physician Assistant (Obstetrics and Gynecology)  Patient awake in PACU  Notes soreness at bellybutton only when she coughs.  Feels like groin pain is better already.  Moving all extremities .  No weakness.  I discussed operative findings, updated the patient's status, discussed probable steps to recovery, and gave postoperative recommendations to the patient and nurse.  Recommendations were made.  Questions were answered.  They expressed understanding & appreciation. We will follow-up closely.  There are no problems to display for this patient.   Past Medical History:  Diagnosis Date   Asthma    Hypertension    IBS (irritable bowel syndrome)    PONV (postoperative nausea and vomiting)    Scope patch helps    Past Surgical History:  Procedure Laterality Date   CESAREAN SECTION  2002   w/ tubal ligation   CHOLECYSTECTOMY  1997   HERNIA REPAIR  2004    Social History   Socioeconomic History   Marital status: Married    Spouse name: Not on file   Number of children: Not on file   Years of education: Not on file   Highest education level: Not on file  Occupational History   Not on file  Tobacco Use   Smoking status: Never   Smokeless tobacco: Never  Vaping Use   Vaping Use: Every day   Substances: Nicotine  Substance and Sexual Activity   Alcohol use: Never   Drug use: Never   Sexual activity: Not on file  Other Topics Concern   Not on file  Social History Narrative   Not on file    Social Determinants of Health   Financial Resource Strain: Not on file  Food Insecurity: Not on file  Transportation Needs: Not on file  Physical Activity: Not on file  Stress: Not on file  Social Connections: Not on file  Intimate Partner Violence: Not on file    History reviewed. No pertinent family history.  Current Facility-Administered Medications  Medication Dose Route Frequency Provider Last Rate Last Admin   0.9 %  sodium chloride infusion  250 mL Intravenous PRN Karie Soda, MD       amisulpride (BARHEMSYS) injection 10 mg  10 mg Intravenous Once PRN Lowella Curb, MD       bupivacaine liposome (EXPAREL) 1.3 % injection 266 mg  20 mL Infiltration Once Karie Soda, MD       Chlorhexidine Gluconate Cloth 2 % PADS 6 each  6 each Topical Once Karie Soda, MD       And   Chlorhexidine Gluconate Cloth 2 % PADS 6 each  6 each Topical Once Karie Soda, MD       [START ON 02/25/2022] feeding supplement (ENSURE PRE-SURGERY) liquid 296 mL  296 mL Oral Once Karie Soda, MD       HYDROmorphone (DILAUDID) injection 0.25-0.5 mg  0.25-0.5 mg Intravenous Q5 min PRN Lowella Curb, MD   0.25 mg at 02/24/22 1255   lactated ringers infusion   Intravenous Continuous Lowella Curb, MD 10  mL/hr at 02/24/22 0930 Restarted at 02/24/22 0931   meperidine (DEMEROL) injection 6.25-12.5 mg  6.25-12.5 mg Intravenous Q5 min PRN Lowella Curb, MD       promethazine [facility]) injection 6.25-12.5 mg  6.25-12.5 mg Intravenous Q15 min PRN Lowella Curb, MD       scopolamine (TRANSDERM-SCOP) 1 MG/3DAYS 1.5 mg  1 patch Transdermal Q72H Lowella Curb, MD   1.5 mg at 02/24/22 0816   sodium chloride flush (NS) 0.9 % injection 3 mL  3 mL Intravenous Catha Gosselin, MD       sodium chloride flush (NS) 0.9 % injection 3 mL  3 mL Intravenous PRN Karie Soda, MD         Allergies  Allergen Reactions   Mobic [Meloxicam] Swelling    Leg swelling. OK to take ibuprofen/aleve    Norvasc [Amlodipine] Swelling   Tamiflu [Oseltamivir] Hives   Tape Dermatitis    Clear transpore ok   Tetracycline Other (See Comments)    Breakthrough bleeding>    BP 134/69   Pulse 85   Temp 97.9 F (36.6 C)   Resp 15   Ht 5\' 8"  (1.727 m)   Wt 113.1 kg   LMP 02/11/2022   SpO2 95%   BMI 37.92 kg/m   No results found.

## 2022-02-24 NOTE — Transfer of Care (Signed)
Immediate Anesthesia Transfer of Care Note  Patient: Hannah Ray  Procedure(s) Performed: LAPAROSCOPY DIAGNOSTIC (Abdomen) LAPAROSCOPIC ABDOMINAL LYSIS OF ADHESIONS, RIGHT TRIPLE NEURECTOMY, RIGHT FEMORAL L HERNIA REPAIR, LEFT INGUINAL HERNIA  REPAIR WITH MESH TAP BLOCK (Bilateral: Abdomen)  Patient Location: PACU  Anesthesia Type:General  Level of Consciousness: awake, oriented, drowsy and patient cooperative  Airway & Oxygen Therapy: Patient Spontanous Breathing and Patient connected to face mask oxygen  Post-op Assessment: Report given to RN and Post -op Vital signs reviewed and stable  Post vital signs: Reviewed and stable  Last Vitals:  Vitals Value Taken Time  BP 152/77 02/24/22 1216  Temp    Pulse 87 02/24/22 1218  Resp 22 02/24/22 1218  SpO2 98 % 02/24/22 1218  Vitals shown include unvalidated device data.  Last Pain:  Vitals:   02/24/22 0744  TempSrc: Oral  PainSc: 4       Patients Stated Pain Goal: 7 (02/24/22 0744)  Complications: No notable events documented.

## 2022-02-24 NOTE — H&P (Signed)
02/24/2022   REFERRING PHYSICIAN: Janey Greaser,*  Patient Care Team: Marrian Salvage, [place] as PCP - General (Obstetrics and Gynecology)  PROVIDER: Jarrett Soho, MD  [facility] MRN: [id number] DOB: Nov 10, 1975  SUBJECTIVE   Chief Complaint: New Consultation (Right inguinal hernia pain)   History of Present Illness: Hannah Ray is a 46 y.o. female who is seen today  as an office consultation at the request of Dr. Janee Morn  for evaluation of New Consultation (Right inguinal hernia pain) .   Patient status post open right inguinal hernia repair by Dr. Gabriel Cirri when she lived up in [place] in 2005. He recalls being told that it was rather low. He has since retired and overall reports not available. She notes she felt right groin pain quickly after surgery and its never really gone away. Struggle with pain that usually is in her right inner thigh and radiates up the inguinal region to her right hip. Been going on for several years. Her sister had problems with neuropathic pain meds so she declined to that. Her surgeon had recommended less than 20 pounds activity. Been struggling. Has had x-ray studies that have disproven any inguinal hernia so complaints she felt had been dismissed. Worsening pain. Not been able to do bicycling or exercising like she used to. Has some pain when she lifts her right knee or sits for long periods of time. Saw Dr. Janee Morn. He offered a pain specialist referral since CT scan showed no obvious recurrence. Pain clinic declined to see her. Ultrasound that showed a left-sided left adnexal mass. Had an MRI of the pelvis that noticed a corpus luteum ovarian physiologic cyst. No obvious abnormality. No obvious hernia. No right adnexal issues. No mention of any hernias. Dr. Janee Morn wished to send to me to see if I had further insights.  Patient is here today with her husband. Somewhat frustrated. She had a lap chole so she moves her bowels about 3 times a  day. No changes there. No rectal bleeding. She can walk about 20 minutes but often has worsening pain in her right inner thigh and groin. She has had prior pelvic x-ray that disproved any fracture. She thought she felt a little swelling down there but there is been no improvement of her hernia. Denies any stress urinary or fecal incontinence. She she does not smoke but she occasionally vapes. No diabetes. No sleep apnea. She has been trying to do some physical therapy but cannot tolerate the pain and discomfort. She occasionally uses a tramadol but does not like to take it. Her sister had a lot of problems with gabapentin so she is wary of trying gabapentin or Lyrica.  Medical History:  Past Medical History:  Diagnosis Date   Anxiety   Asthma, unspecified asthma severity, unspecified whether complicated, unspecified whether persistent   Hypertension   Patient Active Problem List  Diagnosis   Inguinal pain, right   BMI 35.0-35.9,adult   Anxiety   Asthma   Hypertensive disorder   Vitamin D deficiency   Past Surgical History:  Procedure Laterality Date   HERNIA REPAIR Right 2005  open with mesh Dr Gabriel Cirri, [place]   CESAREAN SECTION   CHOLECYSTECTOMY   LAPAROSCOPIC REPAIR OF PARAESOPHAGEAL HERNIA, INCLUDES FUNDOPLASTY, WHEN PERFORMED; WITH IMPLANTATION OF MESH   MINI-LAPAROTOMY W/ TUBAL LIGATION  with c/s    Allergies  Allergen Reactions   Doxycycline Other (See Comments)  Causes breakthrough bleeding   Amlodipine Swelling   Lisinopril Swelling   Tamiflu [  Oseltamivir] Hives   Current Outpatient Medications on File Prior to Visit  Medication Sig Dispense Refill   cholecalciferol 1000 unit tablet Take by mouth   hydroCHLOROthiazide (HYDRODIURIL) 25 MG tablet Take 25 mg by mouth once daily   traMADoL (ULTRAM) 50 mg tablet Take 50 mg by mouth every 6 (six) hours as needed   valsartan (DIOVAN) 40 MG tablet Take 40 mg by mouth 2 (two) times daily   No current  facility-administered medications on file prior to visit.   Family History  Problem Relation Age of Onset   Skin cancer Mother   Obesity Mother   High blood pressure (Hypertension) Mother   Hyperlipidemia (Elevated cholesterol) Mother   Obesity Father   High blood pressure (Hypertension) Father   Hyperlipidemia (Elevated cholesterol) Father   Coronary Artery Disease (Blocked arteries around heart) Father   Diabetes Father   Obesity Sister   High blood pressure (Hypertension) Sister   Hyperlipidemia (Elevated cholesterol) Sister    Social History   Tobacco Use  Smoking Status Former   Types: Cigarettes  Smokeless Tobacco Former    Social History   Socioeconomic History   Marital status: Married  Tobacco Use   Smoking status: Former  Types: Cigarettes   Smokeless tobacco: Former  Building services engineer Use: Some days  Substance and Sexual Activity   Alcohol use: Never   Drug use: Yes   ############################################################  Review of Systems: A complete review of systems (ROS) was obtained from the patient. I have reviewed this information and discussed as appropriate with the patient. See HPI as well for other pertinent ROS.  Constitutional: No fevers, chills, sweats. Weight stable Eyes: No vision changes, No discharge HENT: No sore throats, nasal drainage Lymph: No neck swelling, No bruising easily Pulmonary: No cough, productive sputum CV: No orthopnea, PND . No exertional chest/neck/shoulder/arm pain. Patient can walk 20 minutes with soreness.   GI: No personal nor family history of GI/colon cancer, inflammatory bowel disease, irritable bowel syndrome, allergy such as Celiac Sprue, dietary/dairy problems, colitis, ulcers nor gastritis. No recent sick contacts/gastroenteritis. No travel outside the country. No changes in diet.  Renal: No UTIs, No hematuria Genital: No drainage, bleeding, masses Musculoskeletal: No severe joint pain. Good  ROM major joints Skin: No sores or lesions Heme/Lymph: No easy bleeding. No swollen lymph nodes Neuro: No active seizures. No facial droop Psych: No hallucinations. No agitation  OBJECTIVE   There were no vitals filed for this visit.  There is no height or weight on file to calculate BMI.  PHYSICAL EXAM:  Constitutional: Not cachectic. Hygeine adequate. Vitals signs as above.  Eyes: Pupils reactive, normal extraocular movements. Sclera nonicteric Neuro: CN II-XII intact. No major focal sensory defects. No major motor deficits. Lymph: No head/neck/groin lymphadenopathy Psych: No severe agitation. No severe anxiety. Judgment & insight Adequate, Oriented x4, HENT: Normocephalic, Mucus membranes moist. No thrush. Hearing: adequate Neck: Supple, No tracheal deviation. No obvious thyromegaly Chest: No pain to chest wall compression. Good respiratory excursion. No audible wheezing CV: Pulses intact. regular. No major extremity edema Ext: No obvious deformity or contracture. Edema: Not present. No cyanosis Skin: No major subcutaneous nodules. Warm and dry Musculoskeletal: Severe joint rigidity not present. No obvious clubbing. No digital petechiae. Mobility: no assist device moving easily without restrictions  Abdomen: Obese with panniculus Soft. Nondistended. Nontender. Hernia: Not present. Diastasis recti: Not present. No hepatomegaly. No splenomegaly.  Genital/Pelvic: Left groin without any pain or discomfort. Physiological lymph nodes. Right groin  is very sensitive to palpation especially in the femoral adductor canal more so than the external ring. Cannot feel any definite bulging  inguinal hernia: Not present. Inguinal lymph nodes: without lymphadenopathy nor hidradenitis.   Rectal: (Deferred)    ###################################################################  Labs, Imaging and Diagnostic Testing:  Located in 'Care Everywhere' section of Epic EMR chart  [facility]  NOTES:  Located in [facility]' section of Epic EMR chart  SURGERY NOTES:  Not applicable  PATHOLOGY:  Not applicable  Assessment and Plan:  DIAGNOSES:  Diagnoses and all orders for this visit:  Inguinal pain, right  BMI 35.0-35.9,adult  Other orders - naproxen (EC NAPROSYN) 500 MG EC tablet; Take 1 tablet (500 mg total) by mouth 2 (two) times daily with meals    ASSESSMENT/PLAN  Pleasant woman with right groin inner thigh pain radiating to her hip with greatest sensitivity at femoral region. Pain started after open inguinal hernia repair in 2005 and has never really abated.  This sounds more like a trapped nerve or groin pain but hard to tell for certain.  Since she has had persistent symptoms despite multimodal pain therapy and intervention, I offered diagnostic laparoscopy to rule out a subtle or missed hernia. I am guarded in doing injections along the femoral canal. Can be hard to diagnose a femoral hernia there or a small recurrent hernia when she is supine on the CAT scans and MRIs. Low threshold to do a triple neurectomy of ilioinguinal/iliohypogastric/genitofemoral nerves through right flank/retroperitoneal dissection. Low threshold to do a thin sheet of mesh repair like a sports hernia repair to help out. I wonder if she has a femoral hernia given the fact that her symptoms go to her inner thigh. Discomfort seem worse near the femoral ring and not the inguinal region, and symptoms worse with hip flexion which is classic more for femoral.  I did caution that surgery may not resolve all her symptoms but hopefully can help somewhat. It could make things worse.  Noted that part of this is to eliminate pain which may result in regional numbness that may not fully recover.  However the intention is to have a better result.  She is willing to take that risk as she has been miserable and things have gotten worse despite concerted efforts.  The anatomy & physiology of the  abdominal wall and pelvic floor was discussed. The pathophysiology of hernias in the inguinal and pelvic region was discussed. Natural history risks such as progressive enlargement, pain, incarceration, and strangulation was discussed. Contributors to complications such as smoking, obesity, diabetes, prior surgery, etc were discussed.   I feel the risks of no intervention will lead to serious problems that outweigh the operative risks; therefore, I recommended surgery to reduce and repair the hernia. I explained laparoscopic techniques with possible need for an open approach. I noted usual use of mesh to patch and/or buttress hernia repair  Risks such as bleeding, infection, abscess, need for further treatment, injury to other organs, need for repair of tissues / organs, stroke, heart attack, death, and other risks were discussed. I noted a good likelihood this will help address the problem. Goals of post-operative recovery were discussed as well. Possibility that this will not correct all symptoms was explained. I stressed the importance of low-impact activity, aggressive pain control, avoiding constipation, & not pushing through pain to minimize risk of post-operative chronic pain or injury. Possibility of reherniation was discussed. We will work to minimize complications.   An educational handout  further explaining the pathology & treatment options was given as well. Questions were answered. The patient expresses understanding & wishes to proceed with surgery.

## 2022-02-24 NOTE — Op Note (Signed)
02/24/2022  12:20 PM  PATIENT:  Hannah Ray  46 y.o. female  Patient Care Team: Marrian Salvage, PA-C as PCP - General (Physician Assistant) Karie Soda, MD as Consulting Physician (General Surgery) Lowella Curb, [place] as Physician Assistant (Obstetrics and Gynecology)  PRE-OPERATIVE DIAGNOSIS:   CHRONIC RIGHT GROIN PAIN. POSSIBLE RECURRENT RIGHT INGUINAL HERNIA VERSUS FEMORAL HERNIA  POST-OPERATIVE DIAGNOSIS:   CHRONIC RIGHT GROIN PAIN.  RIGHT FEMORAL HERNIA LEFT INGUINAL HERNIA  PROCEDURE:   LAPAROSCOPIC LYSIS OF ADHESIONS RIGHT TRIPLE NEURECTOMY RIGHT FEMORAL HERNIA REPAIR LEFT INGUINAL HERNIA  REPAIR WITH MESH  TRANSVERSUS ABDOMINIS PLANE (TAP) BLOCK - BILATERAL  SURGEON:  Ardeth Sportsman, MD  ASSISTANT: None  ANESTHESIA:     Regional ilioinguinal and genitofemoral and spermatic cord nerve blocks  General  Regional TRANSVERSUS ABDOMINIS PLANE (TAP) nerve block for perioperative & postoperative pain control provided with liposomal bupivacaine (Experel) mixed with 0.25% bupivacaine as a Bilateral TAP block x 70mL each side at the level of the transverse abdominis & preperitoneal spaces along the flank at the anterior axillary line, from subcostal ridge to iliac crest under laparoscopic guidance    EBL:  Total I/O In: 750 [I.V.:650; IV Piggyback:100] Out: 20 [Blood:20].  See anesthesia record  Delay start of Pharmacological VTE agent (>24hrs) due to surgical blood loss or risk of bleeding:  no  DRAINS: NONE  SPECIMEN:     NONE  DISPOSITION OF SPECIMEN:  N/A  COUNTS:  YES  PLAN OF CARE: Discharge to home after PACU  PATIENT DISPOSITION:  PACU - hemodynamically stable.  INDICATION: Pleasant woman status post open right inguinal hernia repair many years ago with sharp right groin pain for many years.  Concerning.  Wish to get second opinion with our group.  Internally referred given concern for chronic groin pain and possible need for recurrent hernia repair  versus triple neurectomy.  Patient concerned of some upper abdominal swelling her intermittent movement for possible hernia there.  The anatomy & physiology of the abdominal wall and pelvic floor was discussed.  The pathophysiology of hernias in the inguinal and pelvic region was discussed.  Natural history risks such as progressive enlargement, pain, incarceration & strangulation was discussed.   Contributors to complications such as smoking, obesity, diabetes, prior surgery, etc were discussed.    I feel the risks of no intervention will lead to serious problems that outweigh the operative risks; therefore, I recommended surgery to reduce and repair the hernia.  Probable triple neurectomy to help break the cycle of pain on the right groin side.  I explained laparoscopic techniques with possible need for an open approach.  I noted usual use of mesh to patch and/or buttress hernia repair  Risks such as bleeding, infection, abscess, need for further treatment, heart attack, numbness nerve injury, worsening pain death, and other risks were discussed.  I noted a good likelihood this will help address the problem.   Goals of post-operative recovery were discussed as well.  Possibility that this will not correct all symptoms was explained.  I stressed the importance of low-impact activity, aggressive pain control, avoiding constipation, & not pushing through pain to minimize risk of post-operative chronic pain or injury. Possibility of reherniation was discussed.  We will work to minimize complications.     An educational handout further explaining the pathology & treatment options was given as well.  Questions were answered.  The patient expresses understanding & wishes to proceed with surgery.  OR FINDINGS: Moderate thickening and irritation in  the internal ring on the right groin site but no recurrent inguinal hernia.  Small but definite femoral hernia.  Triple neurectomy done of iliohypogastric,  ilioinguinal, genitofemoral nerves on the right side along psoas and quadratus lumborum.  On the left side small indirect inguinal hernia.  No femoral obturator nor direct space hernia.  No evidence of any periumbilical nor upper abdominal hernias.  DESCRIPTION:  The patient was identified & brought into the operating room. The patient was positioned supine with arms tucked. SCDs were active during the entire case. The patient underwent general anesthesia without any difficulty.  The abdomen was prepped and draped in a sterile fashion. The patient's bladder was emptied.  A Surgical Timeout confirmed our plan.  I made a transverse incision through the inferior umbilical fold.  I made a small transverse nick through the anterior rectus fascia contralateral to the inguinal hernia side and placed a 0-vicryl stitch through the fascia.  I placed a Hasson trocar into the preperitoneal plane.  Entry was clean.  We induced carbon dioxide insufflation. Camera inspection revealed no injury.  I used a 20mm angled scope to bluntly free the peritoneum off the infraumbilical anterior abdominal wall.  I created enough of a preperitoneal pocket to place 59mm ports into the left mid-abdomen & left lower quadrant into this preperitoneal cavity.  I focused attention on the RIGHT pelvis since that was the dominant hernia side.   I used blunt & focused sharp dissection to free the peritoneum off the flank and down to the pubic rim.  I freed the anteriolateral bladder wall off the anteriolateral pelvic wall, sparing midline attachments.   I located a swath of peritoneum going into a hernia fascial defect at the  femoral foramen consistent with  a femoral hernia..  I gradually freed the peritoneal hernia sac off safely and reduced it into the preperitoneal space.  I freed the peritoneum off the round ligament.  I freed peritoneum off the retroperitoneum along the psoas muscle.  Some fat along the iliac vein and artery carefully  skeletonized and removed.   There is no evidence of any indirect inguinal hernia nor direct space hernia.  No evidence of hernia recurrence.  Given the fact that the femoral hernia was rather small and her story concerning for nerve pain proceeded with triple neurectomy.  I mobilized the peritoneum and fat off of the iliac crest and psoas muscle and quadratus lumborum musculature on the right posterolateral retroperitoneum and right flank.  Identify the 12th rib.  Saw intercostal nerve at the as a reference point.  Proceeded to skeletonize the fat off of the musculature.  There are a few right flank with be bands of tissue.  It seemed to anterior to be iliohypogastric nerves but did skeletonize.  It was extremely thin and wispy.  I did place clips on it and trimmed off a 7 cm segment.  Suspect that was the lateral femoral cutaneous nerve.  There was another tissue that was extremely thin as well very anterolateral tendon was not consistent with nerve.  Once those are freed off I can more carefully identify the genitofemoral nerve going on the anterior psoas muscle.  Very long and wispy with a wish bone branching distally to the internal ring and femoral hernia consistent with a genitofemoral nerve.  Heading more proximally on the quadratus lumborum above the iliac crest I could identify 2 tubular structures consistent with the remaining nerves.  On the lateral anterior medial quadratus lumborum I saw  a nerve that seem classic for the iliohypogastric nerve.  Found a another nerve running anterior on the medial quadratus.  Consistent with the ileoinguinal.  I was able to elevate & skeletonized all 3 nerves off the retroperitoneum for 12 cm,  consistent with classic nerves.  I went and proceeded with triple neurectomy of these thin skeletonized nerves in the classic location, confirming anatomy.  Placed clips proximally and distally and transected 7-8 cm segments and sent them off.    Irrigation and assured  hemostasis.  Closed a peritoneal defect using 3-0 lock serrated absorbable suture.  Hemostasis was good.  Iliac vein & artery intact.  Able to mobilize around ligament within the internal ring to break up any potential adhesions and irritation but again confirm no hernia there.  I turned attention on the opposite  LEFT pelvis.  I did dissection in a similar, mirror-image fashion. The patient had an indirect inguinal hernia.Marland Kitchen   Spermatic cord lipoma was dissected away & removed.    I checked & assured hemostasis.     I chose sheets of lightweight polypropylene Bard Soft Mesh 15x15cm, one for each side.  I cut a single sigmoid-shaped slit ~6cm from a corner of each mesh.  I placed the meshes into the preperitoneal space & laid them as overlapping diamonds such that at the inferior points, a 6x6 cm corner flap rested in the true anterolateral pelvis, covering the obturator & femoral foramina.   I allowed the bladder to return to the pubis, this helping tuck the corners of the mesh in the anteriolateral pelvis.  The medial corners overlapped each other across midline cephalad to the pubic rim.   Given the numerous hernias of moderate size, I placed a third 15x15cm mesh in the center as a vertical diamond.  The lateral wings of the mesh overlap across the direct spaces and internal rings where the dominant hernias were.  This provided good coverage and reinforcement of the hernia repairs.  Because of the central mesh placement with good overlap, I did not place any tacks.   I held the hernia sacs cephalad & evacuated carbon dioxide.  I closed the fascia with absorbable suture.  I closed the skin using 4-0 monocryl stitch.  Sterile dressings were applied.   The patient was extubated & arrived in the PACU in stable condition..  I had discussed postoperative care with the patient in the holding area.  Instructions are written in the chart.  I discussed operative findings, updated the patient's status, discussed  probable steps to recovery, and gave postoperative recommendations to the patient's spouse, Justion Winemiller.  Recommendations were made.  Questions were answered.  He expressed understanding & appreciation.   Adin Hector, M.D., F.A.C.S. Gastrointestinal and Minimally Invasive Surgery [facility] [address] [place], [place] [address] [phone] Main / Paging  02/24/2022 12:20 PM

## 2022-02-27 ENCOUNTER — Encounter (HOSPITAL_BASED_OUTPATIENT_CLINIC_OR_DEPARTMENT_OTHER): Payer: Self-pay | Admitting: Surgery

## 2022-02-27 LAB — SURGICAL PATHOLOGY

## 2022-03-21 DIAGNOSIS — N3941 Urge incontinence: Secondary | ICD-10-CM | POA: Diagnosis not present

## 2022-03-21 DIAGNOSIS — K4190 Unilateral femoral hernia, without obstruction or gangrene, not specified as recurrent: Secondary | ICD-10-CM | POA: Diagnosis not present

## 2022-03-21 DIAGNOSIS — R1031 Right lower quadrant pain: Secondary | ICD-10-CM | POA: Diagnosis not present

## 2022-03-21 DIAGNOSIS — K4090 Unilateral inguinal hernia, without obstruction or gangrene, not specified as recurrent: Secondary | ICD-10-CM | POA: Diagnosis not present

## 2022-03-22 ENCOUNTER — Other Ambulatory Visit: Payer: Self-pay

## 2022-03-22 ENCOUNTER — Emergency Department (HOSPITAL_BASED_OUTPATIENT_CLINIC_OR_DEPARTMENT_OTHER): Payer: BC Managed Care – PPO

## 2022-03-22 ENCOUNTER — Emergency Department (HOSPITAL_COMMUNITY): Payer: BC Managed Care – PPO

## 2022-03-22 ENCOUNTER — Encounter (HOSPITAL_BASED_OUTPATIENT_CLINIC_OR_DEPARTMENT_OTHER): Payer: Self-pay

## 2022-03-22 ENCOUNTER — Emergency Department (HOSPITAL_BASED_OUTPATIENT_CLINIC_OR_DEPARTMENT_OTHER)
Admission: EM | Admit: 2022-03-22 | Discharge: 2022-03-23 | Disposition: A | Discharge status: Home / Self Care | Payer: BC Managed Care – PPO | Attending: Emergency Medicine | Admitting: Emergency Medicine

## 2022-03-22 DIAGNOSIS — S22009A Unspecified fracture of unspecified thoracic vertebra, initial encounter for closed fracture: Secondary | ICD-10-CM | POA: Diagnosis not present

## 2022-03-22 DIAGNOSIS — R531 Weakness: Secondary | ICD-10-CM | POA: Insufficient documentation

## 2022-03-22 DIAGNOSIS — E876 Hypokalemia: Secondary | ICD-10-CM | POA: Insufficient documentation

## 2022-03-22 DIAGNOSIS — R4789 Other speech disturbances: Secondary | ICD-10-CM | POA: Insufficient documentation

## 2022-03-22 DIAGNOSIS — M4802 Spinal stenosis, cervical region: Secondary | ICD-10-CM | POA: Diagnosis not present

## 2022-03-22 DIAGNOSIS — R4182 Altered mental status, unspecified: Secondary | ICD-10-CM | POA: Diagnosis not present

## 2022-03-22 DIAGNOSIS — M50222 Other cervical disc displacement at C5-C6 level: Secondary | ICD-10-CM | POA: Diagnosis not present

## 2022-03-22 DIAGNOSIS — R4701 Aphasia: Secondary | ICD-10-CM | POA: Diagnosis not present

## 2022-03-22 DIAGNOSIS — Z20822 Contact with and (suspected) exposure to covid-19: Secondary | ICD-10-CM | POA: Diagnosis not present

## 2022-03-22 DIAGNOSIS — R200 Anesthesia of skin: Secondary | ICD-10-CM | POA: Insufficient documentation

## 2022-03-22 DIAGNOSIS — M5020 Other cervical disc displacement, unspecified cervical region: Secondary | ICD-10-CM | POA: Diagnosis not present

## 2022-03-22 DIAGNOSIS — M5021 Other cervical disc displacement,  high cervical region: Secondary | ICD-10-CM | POA: Diagnosis not present

## 2022-03-22 DIAGNOSIS — R202 Paresthesia of skin: Secondary | ICD-10-CM | POA: Diagnosis not present

## 2022-03-22 LAB — RESP PANEL BY RT-PCR (FLU A&B, COVID) ARPGX2
Influenza A by PCR: NEGATIVE
Influenza B by PCR: NEGATIVE
SARS Coronavirus 2 by RT PCR: NEGATIVE

## 2022-03-22 LAB — ETHANOL: Alcohol, Ethyl (B): <10 mg/dL (ref <10)

## 2022-03-22 LAB — COMPREHENSIVE METABOLIC PANEL
ALT: 19 U/L (ref 0–44)
AST: 15 U/L (ref 15–41)
Albumin: 4.5 g/dL (ref 3.5–5.0)
Alkaline Phosphatase: 42 U/L (ref 38–126)
Anion gap: 11 (ref 5–15)
BUN: 14 mg/dL (ref 6–20)
CO2: 27 mmol/L (ref 22–32)
Calcium: 9.4 mg/dL (ref 8.9–10.3)
Chloride: 102 mmol/L (ref 98–111)
Creatinine, Ser: 0.62 mg/dL (ref 0.44–1.00)
GFR, Estimated: >60 mL/min (ref >60)
Glucose, Bld: 118 mg/dL — ABNORMAL HIGH (ref 70–99)
Potassium: 3.1 mmol/L — ABNORMAL LOW (ref 3.5–5.1)
Sodium: 140 mmol/L (ref 135–145)
Total Bilirubin: 0.5 mg/dL (ref 0.3–1.2)
Total Protein: 7.3 g/dL (ref 6.5–8.1)

## 2022-03-22 LAB — DIFFERENTIAL
Abs Immature Granulocytes: 0.02 10*3/uL (ref 0.00–0.07)
Basophils Absolute: 0.1 10*3/uL (ref 0.0–0.1)
Basophils Relative: 1 %
Eosinophils Absolute: 0.4 10*3/uL (ref 0.0–0.5)
Eosinophils Relative: 5 %
Immature Granulocytes: 0 %
Lymphocytes Relative: 28 %
Lymphs Abs: 2 10*3/uL (ref 0.7–4.0)
Monocytes Absolute: 0.4 10*3/uL (ref 0.1–1.0)
Monocytes Relative: 6 %
Neutro Abs: 4.2 10*3/uL (ref 1.7–7.7)
Neutrophils Relative %: 60 %

## 2022-03-22 LAB — PREGNANCY, URINE: Preg Test, Ur: NEGATIVE

## 2022-03-22 LAB — CBC
HCT: 38.5 % (ref 36.0–46.0)
Hemoglobin: 13 g/dL (ref 12.0–15.0)
MCH: 28.8 pg (ref 26.0–34.0)
MCHC: 33.8 g/dL (ref 30.0–36.0)
MCV: 85.2 fL (ref 80.0–100.0)
Platelets: 269 10*3/uL (ref 150–400)
RBC: 4.52 MIL/uL (ref 3.87–5.11)
RDW: 13.3 % (ref 11.5–15.5)
WBC: 7.1 10*3/uL (ref 4.0–10.5)
nRBC: 0 % (ref 0.0–0.2)

## 2022-03-22 LAB — CBG MONITORING, ED: Glucose-Capillary: 117 mg/dL — ABNORMAL HIGH (ref 70–99)

## 2022-03-22 LAB — PROTIME-INR
INR: 1 (ref 0.8–1.2)
Prothrombin Time: 13.3 seconds (ref 11.4–15.2)

## 2022-03-22 LAB — APTT: aPTT: 32 seconds (ref 24–36)

## 2022-03-22 MED ORDER — LORAZEPAM 2 MG/ML IJ SOLN
0.5000 mg | INTRAMUSCULAR | Status: DC | PRN
  Filled 2022-03-22: qty 1

## 2022-03-22 MED ORDER — SODIUM CHLORIDE 0.9% FLUSH
3.0000 mL | Freq: Once | INTRAVENOUS | Status: DC
  Filled 2022-03-22: qty 3

## 2022-03-22 MED ORDER — GADOBUTROL 1 MMOL/ML IV SOLN
10.0000 mL | Freq: Once | INTRAVENOUS | Status: AC | PRN
  Administered 2022-03-22: 10 mL via INTRAVENOUS

## 2022-03-22 MED ORDER — LORAZEPAM 2 MG/ML IJ SOLN
1.0000 mg | INTRAMUSCULAR | Status: DC | PRN
  Administered 2022-03-22: 1 mg via INTRAVENOUS

## 2022-03-22 MED ORDER — ACETAMINOPHEN 500 MG PO TABS
1000.0000 mg | ORAL_TABLET | Freq: Once | ORAL | Status: AC
  Administered 2022-03-22: 1000 mg via ORAL
  Filled 2022-03-22: qty 2

## 2022-03-22 MED ORDER — LORAZEPAM 1 MG PO TABS: 0.5000 mg | ORAL_TABLET | ORAL | Status: DC | PRN

## 2022-03-22 MED ORDER — POTASSIUM CHLORIDE CRYS ER 20 MEQ PO TBCR
40.0000 meq | EXTENDED_RELEASE_TABLET | Freq: Once | ORAL | Status: AC
  Administered 2022-03-22: 40 meq via ORAL
  Filled 2022-03-22: qty 2

## 2022-03-22 NOTE — ED Triage Notes (Signed)
Pt presents POV from home with spouse. Pt is here for ongoing brain fog, Left arm numbness, loss of function to her Right upper leg.  Pt reports at times she has difficulty finding her words. Pt reports all of this started after surgery 02/24/2022. Pt reports she had same day abd surgery and noticed the difference when she "fully woke up" from anaesthesia.  Pt denies seeing anyone until yesterday. She had a f/u with surgeon  yesterday. They are trying to schedule her with a neurologist but informed pt it could take months. Pt is here today to rule out a possible stoke.   Pt last known well 0900 02/24/2022   No new symptoms since then, just ongoing symptoms. Pt alert and oriented x4, ambulatory in triage, left grip is weaker, no drift noted, slight Right side facial droop that clears up when patient smiles. Spouse states, "I notice it now but not sure if I've seen that before"   Pt reports her head feels like "pressure" when she tries to think.  This RN confirmed again that pt's symptoms have been ongoing since surgery, no new symptoms in the last 4 hours

## 2022-03-22 NOTE — ED Notes (Signed)
Pt cleared for MRI, questions answer regarding stand, they will call back when they are ready for pt and send transporter, this RN will give PRN Ativan (see MAR) prior to MRI study

## 2022-03-22 NOTE — ED Notes (Signed)
Pt able to stand and ambulate into hall for visual acuity w/o difficulty, steady gait noted

## 2022-03-22 NOTE — ED Provider Notes (Signed)
  Physical Exam  BP 126/75 (BP Location: Left Arm)   Pulse 63   Temp 98.3 F (36.8 C) (Oral)   Resp 17   Ht 5\' 7"  (1.702 m)   Wt 113.4 kg   LMP 02/11/2022   SpO2 98%   BMI 39.16 kg/m     Procedures  Procedures  ED Course / MDM   Clinical Course as of 03/22/22 2312  Wed Mar 22, 2022  2137 Temp(!): 101.2 F (38.4 C) [JL]    Clinical Course User Index [JL] Mar 24, 2022, MD   Medical Decision Making Amount and/or Complexity of Data Reviewed Labs: ordered. Radiology: ordered.  Risk OTC drugs. Prescription drug management.   Received patient handoff from med center [facility] for MRI imaging of the brain due to multiple neurologic deficits.  Please see prior ED provider's note for full HPI and MDM.  Briefly, the patient states that since surgery on 7/14 she has had intermittent word finding difficulty, intermittent left arm numbness and weakness in the left arm with decreased grip strength in left arm.  She also has had numbness to the right thigh.  She underwent right triple neurectomy in addition to an lysis of adhesions, diagnostic laparotomy, right femoral hernia repair, left inguinal hernia repair.  She denies any fevers or chills.  She denies any trauma to her neck.  She was found to be febrile to 101.2 in the emergency department which subsequently resolved on recheck.  Her surgeon was concerned possibly for MS.  MRI imaging was performed prior to my evaluation without contrast of the brain which resulted negative for evidence of acute stroke or other lesions.  A chronic cerebellar lesion was noted: IMPRESSION:  No evidence of acute intracranial abnormality.    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    I did speak with Dr. 8/14 of on-call neurology who felt that the patient would benefit from CT with and without contrast of the cervical spine given the patient's symptoms of left arm weakness with decreased grip strength.  Remainder of  laboratory workup significant for blood glucose 117, COVID-19 influenza PCR testing collected and resulted negative, CMP without significant electrolyte normality with hypokalemia to 3.1 noted, replenished orally, CBC without a leukocytosis or anemia.  At time of signout to follow-up MRI imaging of the cervical spine with and without contrast.  If negative, plan for outpatient neurologic evaluation.  Referral placed for follow-up outpatient with neurology. Signout given to Dr. Wilford Corner at 2345.       2346, MD 03/22/22 2316

## 2022-03-22 NOTE — ED Provider Notes (Signed)
[facility] EMERGENCY DEPT Provider Note   CSN: [id number] Arrival date & time: 03/22/22  1213     History  Chief Complaint  Patient presents with   Numbness    Hannah Ray is a 46 y.o. female.  Patient presents with foggy headedness, word finding difficulty, intermittent left arm numbness and mild right leg weakness since surgery on 7/14.  Patient feels symptoms have gradually worsened.  No history of known stroke no blood thinner use.  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  Patient feels intermittent dizziness.  Spouse concerned.  Patient had surgeon make an appoint with neurologist however it is not for a few months.  Patient presented for stroke workup.       Home Medications Prior to Admission medications   Medication Sig Start Date End Date Taking? Authorizing Provider  albuterol (VENTOLIN HFA) 108 (90 Base) MCG/ACT inhaler Inhale into the lungs every 6 (six) hours as needed for wheezing or shortness of breath.    [provider]  hydrochlorothiazide (HYDRODIURIL) 25 MG tablet Take 25 mg by mouth daily.    [provider]  methocarbamol (ROBAXIN) 750 MG tablet Take 1 tablet (750 mg total) by mouth 4 (four) times daily as needed (use for muscle cramps/pain). 02/24/22   Karie Soda, MD  NON FORMULARY Heather's tummy tamer + enzyme to help with IBS    [provider]  traMADol (ULTRAM) 50 MG tablet Take 1-2 tablets (50-100 mg total) by mouth every 6 (six) hours as needed for moderate pain or severe pain. 02/24/22   Karie Soda, MD  valsartan (DIOVAN) 40 MG tablet Take 40 mg by mouth daily.    [provider]      Allergies    Mobic [meloxicam], Norvasc [amlodipine], Tamiflu [oseltamivir], Tape, Tetracycline, and Trazodone    Review of Systems   Review of Systems  Constitutional:  Negative for chills and fever.  HENT:  Negative for congestion.   Eyes:  Negative for visual  disturbance.  Respiratory:  Negative for shortness of breath.   Cardiovascular:  Negative for chest pain.  Gastrointestinal:  Negative for abdominal pain and vomiting.  Genitourinary:  Negative for dysuria and flank pain.  Musculoskeletal:  Negative for back pain, neck pain and neck stiffness.  Skin:  Negative for rash.  Neurological:  Positive for weakness, numbness and headaches. Negative for light-headedness.    Physical Exam Updated Vital Signs BP (!) 146/90   Pulse 86   Temp 98.5 F (36.9 C)   Resp (!) 22   Ht 5\' 7"  (1.702 m)   Wt 113.4 kg   LMP 02/11/2022   SpO2 95%   BMI 39.16 kg/m  Physical Exam Vitals and nursing note reviewed.  Constitutional:      General: She is not in acute distress.    Appearance: She is well-developed.  HENT:     Head: Normocephalic and atraumatic.     Mouth/Throat:     Mouth: Mucous membranes are moist.  Eyes:     General:        Right eye: No discharge.        Left eye: No discharge.     Conjunctiva/sclera: Conjunctivae normal.  Neck:     Trachea: No tracheal deviation.  Cardiovascular:     Rate and Rhythm: Normal rate and regular rhythm.     Heart sounds: No murmur heard. Pulmonary:     Effort: Pulmonary effort is normal.  Breath sounds: Normal breath sounds.  Abdominal:     General: There is no distension.     Palpations: Abdomen is soft.     Tenderness: There is no abdominal tenderness. There is no guarding.  Musculoskeletal:     Cervical back: Normal range of motion and neck supple. No rigidity.  Skin:    General: Skin is warm.     Capillary Refill: Capillary refill takes less than 2 seconds.     Findings: No rash.  Neurological:     General: No focal deficit present.     Mental Status: She is alert.     Cranial Nerves: No cranial nerve deficit.     Sensory: Sensory deficit present.     Coordination: Coordination normal.     Comments: Patient has decreased sensation right lateral thigh however she says this is  expected from her surgery.  Patient decree sensation left upper extremity.  Mild right leg weakness compared to left however can hold against gravity.  Equal strength upper extremities no arm drift.  Visual fields intact.  Finger-nose intact.  Psychiatric:        Mood and Affect: Mood normal.     ED Results / Procedures / Treatments   Labs (all labs ordered are listed, but only abnormal results are displayed) Labs Reviewed  COMPREHENSIVE METABOLIC PANEL - Abnormal; Notable for the following components:      Result Value   Potassium 3.1 (*)    Glucose, Bld 118 (*)    All other components within normal limits  CBG MONITORING, ED - Abnormal; Notable for the following components:   Glucose-Capillary 117 (*)    All other components within normal limits  PROTIME-INR  APTT  CBC  DIFFERENTIAL  ETHANOL  PREGNANCY, URINE    EKG None  Radiology CT HEAD WO CONTRAST  Result Date: 03/22/2022 CLINICAL DATA:  Altered mental status, numbness in left arm, weakness right lower extremity EXAM: CT HEAD WITHOUT CONTRAST TECHNIQUE: Contiguous axial images were obtained from the base of the skull through the vertex without intravenous contrast. RADIATION DOSE REDUCTION: This exam was performed according to the departmental dose-optimization program which includes automated exposure control, adjustment of the mA and/or kV according to patient size and/or use of iterative reconstruction technique. COMPARISON:  None Available. FINDINGS: Brain: No acute intracranial findings are seen. There are no signs of bleeding within the cranium. Ventricles are not dilated. There is no focal edema or mass effect. Vascular: Unremarkable. Skull: Unremarkable. Sinuses/Orbits: Unremarkable. Other: None. IMPRESSION: No acute intracranial findings are seen in noncontrast CT brain. Electronically Signed   By: Ernie Avena M.D.   On: 03/22/2022 12:53    Procedures Procedures    Medications Ordered in ED Medications   sodium chloride flush (NS) 0.9 % injection 3 mL (3 mLs Intravenous Not Given 03/22/22 1314)  potassium chloride SA (KLOR-CON M) CR tablet 40 mEq (has no administration in time range)    ED Course/ Medical Decision Making/ A&P                           Medical Decision Making Amount and/or Complexity of Data Reviewed Labs: ordered. Radiology: ordered.   Patient presents with intermittent gradually worsening neuro signs and symptoms since surgery.  Patient is overall well-appearing, mild decrease sensation on exam, no aphasia on my exam.  With persistent worsening concerns stroke screening obtained.  CT scan of the head reviewed independently no acute bleeding or large  stroke, blood work reassuring overall except for mild hypokalemia 3.1, oral potassium ordered.  Unable to get MRI at [facility].  Discussed with Dr. Julieanne Manson who agrees with patient coming over for MRI of the brain to look for any occult stroke.  Significant other comfortable driving the patient over.  Patient stable for transfer to other [facility] for [facility].  EKG reviewed no acute abnormalities.        Final Clinical Impression(s) / ED Diagnoses Final diagnoses:  Word finding difficulty  Left arm numbness  Hypokalemia    Rx / DC Orders ED Discharge Orders     None         Blane Ohara, MD 03/22/22 1426

## 2022-03-22 NOTE — Discharge Instructions (Addendum)
Your MRI of the brain revealed the following: IMPRESSION:  No evidence of acute intracranial abnormality.    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    MRI of the cervical spine revealed the following: 1. No acute abnormality within the cervical spine or spinal cord. 2. Central to left paracentral disc protrusions at C2-3 through C5-6 with resultant mild diffuse spinal stenosis. 3. Mild left C5 and C6 foraminal stenosis related to disc bulge and uncovertebral disease. 4. Small remote left cerebellar infarct. Recommend follow-up outpatient with neurology for continued evaluation and management.

## 2022-03-23 ENCOUNTER — Encounter: Payer: Self-pay | Admitting: Neurology

## 2022-03-23 NOTE — ED Notes (Signed)
Pt and husband noted to be sleeping, even RR and unlabored, NAD noted, safety in place, care on going, will continue to monitor.

## 2022-03-23 NOTE — ED Notes (Signed)
Pt return from CT scanner, NAD noted, pt alert and awake

## 2022-03-24 NOTE — Therapy (Unsigned)
OUTPATIENT PHYSICAL THERAPY FEMALE PELVIC EVALUATION   Patient Name: Hannah Ray MRN: [id number] DOB:03-29-1976, 46 y.o., female Today's Date: 03/27/2022   PT End of Session - 03/27/22 1154     Visit Number 1    Date for PT Re-Evaluation 06/19/22    Authorization Type [employer]    PT Start Time 0930    PT Stop Time [id number]    PT Time Calculation (min) 45 min    Activity Tolerance Patient tolerated treatment well    Behavior During Therapy WFL for tasks assessed/performed             Past Medical History:  Diagnosis Date   Asthma    Hypertension    IBS (irritable bowel syndrome)    PONV (postoperative nausea and vomiting)    Scope patch helps   Past Surgical History:  Procedure Laterality Date   CESAREAN SECTION  2002   w/ tubal ligation   CHOLECYSTECTOMY  1997   HERNIA REPAIR  2004   INGUINAL HERNIA REPAIR Bilateral 02/24/2022   Procedure: LAPAROSCOPIC ABDOMINAL LYSIS OF ADHESIONS, RIGHT TRIPLE NEURECTOMY, RIGHT FEMORAL L HERNIA REPAIR, LEFT INGUINAL HERNIA  REPAIR WITH MESH TAP BLOCK;  Surgeon: Michael Boston, MD;  Location: [facility];  Service: General;  Laterality: Bilateral;   LAPAROSCOPY N/A 02/24/2022   Procedure: LAPAROSCOPY DIAGNOSTIC;  Surgeon: Michael Boston, MD;  Location: [facility];  Service: General;  Laterality: N/A;   There are no problems to display for this patient.   PCP: Aldean Ast, PA-C  REFERRING PROVIDER: Michael Boston, MD  REFERRING DIAG:  R10.31 (ICD-10-CM) - Inguinal pain, right  K41.90 (ICD-10-CM) - Unilateral femoral hernia, without obstruction or gangrene, not specified as recurrent  K40.90 (ICD-10-CM) - Unilateral inguinal hernia, without obstruction or gangrene, not specified as recurrent  N39.41 (ICD-10-CM) - Urge incontinence  R20.0 (ICD-10-CM) - Anesthesia of skin    THERAPY DIAG:  Muscle weakness (generalized)  Other lack of coordination  Pain in right leg  Rationale for Evaluation and  Treatment Rehabilitation  ONSET DATE: 02/24/2022  SUBJECTIVE:                                                                                                                                                                                           SUBJECTIVE STATEMENT: She had surgery to repair for inguinal hernia. Patient took gabapentin and helped with her nerve pain. She is not able to bring right leg onto left leg. Patient had a recent stroke. 7 bulging disc in neck. She is going to see a Psychologist, sport and exercise for this. Walk 15 minutes at a time and gets tired  if goes longer. They are still ruling out MS. Patient has a urologist appointment.  Fluid intake: Yes: water     PAIN:  Are you having pain? Yes NPRS scale: 4/10 Pain location:  right hip down to the lateral right knee.   Pain type: aching and tearing, tender Pain description: constant and the lateral right thigh is intermittent.     Aggravating factors: walking more than 15 minutes Relieving factors: heat  PRECAUTIONS: Other: recent stroke with short term memory loss; Bilateral hernia repair on 02/24/2022 ;   WEIGHT BEARING RESTRICTIONS No  FALLS:  Has patient fallen in last 6 months? No  LIVING ENVIRONMENT: Lives with: lives with their family  OCCUPATION: medical leave due to the stroke, has an office job  PLOF: Independent  PATIENT GOALS be able to cross her right leg over the left  PERTINENT HISTORY:  C-section; hernia repair 2004 and bilateral inguinal hernia 02/24/2022, IBS; recent stroke in the past month, Right triple neurectomy 02/24/2022   URINATION Pain with urination: No Fully empty bladder: Yes:   Stream: Strong Urgency: No Frequency: urinates every 1-2 hours; able to start the urine but unable to stop it. No urge until she starts to leak and is new since the surgery.  Leakage:  she does not realize it.  Pads: Yes: 2-4 pads per day  I PREGNANCY Vaginal deliveries 2 Tearing Yes: small amount C-section  deliveries 1 Currently pregnant No    OBJECTIVE:   DIAGNOSTIC FINDINGS:  none   COGNITION:  Overall cognitive status: Impaired Trouble with short term memory and explaining things    SENSATION:  Light touch: Deficits along the right lateral thigh  Proprioception: Appears intact   FUNCTIONAL TESTS:   GAIT: Distance walked: walked 20 feet Assistive device utilized: None Level of assistance: Complete Independence Comments: she will sometimes drag her right foot , decreased right hip flexion               POSTURE: No Significant postural limitations   PELVIC ALIGNMENT: in correct alignment  LUMBARAROM/PROM lumbar ROM is full  LOWER EXTREMITY ROM:  Active ROM Right eval Left eval  Hip flexion Active 80; passive 110 110  Hip internal rotation Passive 8 with end range pain   Hip external rotation Passive 40 with end range pain    (Blank rows = not tested)  LOWER EXTREMITY MMT:  MMT Right eval Left eval  Hip extension 4+/5   Hip abduction 3/5   Hip adduction 4/5     PALPATION:   General  surgical site in the umbilicus is restricted, tenderness along the c-section scar and where the patches were placed. Tightness in right hip adductor; tenderness located in the right thoracic lumbar area where the neurectomy was perfromed.                  Patient confirms identification and approves PT to assess internal pelvic floor and treatment No, deferred Patient reports she has placed her finger in her vaginal canal and felt a circular contraction   PELVIC MMT:   MMT eval  Vaginal   Internal Anal Sphincter   External Anal Sphincter   Puborectalis   Diastasis Recti   (Blank rows = not tested)         TODAY'S TREATMENT  EVAL Date: 03/27/2022 HEP established-see below    PATIENT EDUCATION:  Education details: Access Code: [email or web address] Person educated: Patient and Spouse Education method: Explanation, Demonstration, Tactile cues, Verbal cues, and Handouts Education  comprehension: verbalized understanding, returned demonstration, verbal cues required, tactile cues required, and needs further education   HOME EXERCISE PROGRAM: 03/27/2022 Access Code: [email or web address] URL: [email or web address] Date: 03/27/2022 Prepared by: Eulis Foster  Exercises - Seated Pelvic Floor Contraction  - 3 x daily - 7 x weekly - 1 sets - 10 reps - 5 sec hold - Seated Quick Flick Pelvic Floor Contractions  - 3 x daily - 7 x weekly - 1 sets - 5 reps  ASSESSMENT:  CLINICAL IMPRESSION: Patient is a 46 y.o. female who was seen today for physical therapy evaluation and treatment for right inguinal pain and urge incontinence. Patient had bilateral hernia repair with mesh on 02/24/2022. She has had a recent stroke. She is going to see a neurosurgeon due to 7 herniated disc in her neck. She will be seeing a urologist. Patient has difficulty with short term memory since she had the stroke. Patient deferred internal assessment of the pelvic floor but she has tried contracting her vaginal around her finger and felt a circular contraction for several seconds. Patient will leak urine without realizing because she does not get the urge to urinate. She has put herself on a schedule to urinate every 1.5 to 2 hours. Patient is not able to cross her right leg over her left and her goal in therapy is to be able to do this. She has weakness in right hip extension, abduction and adduction. Right hip flexion actively is 80 degrees but passively is 110 degrees with pain at endrange. She has limited right hip rotation with endrange pain. Patient will drag her right foot at times. Surgical site on the umbilicus is restricted. She has some pain and restrictions suprapubically since she had the hernia repair there.Tightness in right hip adductor and tenderness located in the right thoracic lumbar area where the neurectomy was perfromed.  Patient will benefit from skilled therapy to improve mobility and  strength of right hip and reduce her urinary leakage.    OBJECTIVE IMPAIRMENTS decreased activity tolerance, decreased coordination, decreased endurance, difficulty walking, decreased ROM, decreased strength, increased fascial restrictions, increased muscle spasms, impaired sensation, and pain.   ACTIVITY LIMITATIONS continence, dressing, and locomotion level  PARTICIPATION LIMITATIONS: driving, shopping, and community activity  PERSONAL FACTORS C-section; hernia repair 2004 and bilateral inguinal hernia 02/24/2022, IBS; recent stroke in the past month, Right triple neurectomy 02/24/2022 are also affecting patient's functional outcome.   REHAB POTENTIAL: Good  CLINICAL DECISION MAKING: Evolving/moderate complexity  EVALUATION COMPLEXITY: Moderate   GOALS: Goals reviewed with patient? Yes  SHORT TERM GOALS: Target date: 04/24/2022  Patient is independent with initial HEP for hip stretches.  Baseline: Goal status: INITIAL  2.  Patient is independent with pelvic floor exercises with contraction with exercise and holding for 5 sec.  Baseline:  Goal status: INITIAL   LONG TERM GOALS: Target date: 06/19/2022   Patient is independent with advanced HEP for hip and pelvic floor strength.  Baseline:  Goal status: INITIAL  2.  Patient is able to cross her right leg over the left so she can put her shoes on due to increased in right hip external rotation >/= 60 degrees  Baseline:  Goal status: INITIAL  3.  Patient has increased in right hip flexion actively >/= 105 degrees so she is able to lift her right leg to cross her leg over the left.  Baseline:  Goal status: INITIAL  4.  Patient report she is able to feel the urge to urinate so her  urinary leakage improved >/= 50%.  Baseline:  Goal status: INITIAL    PLAN: PT FREQUENCY: 1-2x/week  PT DURATION: 12 weeks  PLANNED INTERVENTIONS: Therapeutic exercises, Therapeutic activity, Neuromuscular re-education, Balance training,  Gait training, Patient/Family education, Self Care, Joint mobilization, Dry Needling, Electrical stimulation, Cryotherapy, Moist heat, scar mobilization, Biofeedback, and Manual therapy  PLAN FOR NEXT SESSION: hip stretches on the right, manual work to the hip adductors, work on right hip flexion and external rotation, gentle manual work to the right inguinal area, increase pelvic floor contraction to 5 seconds if she is able to. She has trouble with short term memory so needs HEP written down.    Earlie Counts, PT 03/27/22 4:56 PM

## 2022-03-27 ENCOUNTER — Other Ambulatory Visit: Payer: Self-pay

## 2022-03-27 ENCOUNTER — Ambulatory Visit: Payer: BC Managed Care – PPO | Attending: Surgery | Admitting: Physical Therapy

## 2022-03-27 ENCOUNTER — Encounter: Payer: Self-pay | Admitting: Physical Therapy

## 2022-03-27 DIAGNOSIS — R278 Other lack of coordination: Secondary | ICD-10-CM | POA: Insufficient documentation

## 2022-03-27 DIAGNOSIS — M6281 Muscle weakness (generalized): Secondary | ICD-10-CM | POA: Insufficient documentation

## 2022-03-27 DIAGNOSIS — M79604 Pain in right leg: Secondary | ICD-10-CM | POA: Diagnosis not present

## 2022-04-04 ENCOUNTER — Encounter: Payer: Self-pay | Admitting: Physical Therapy

## 2022-04-04 ENCOUNTER — Ambulatory Visit: Payer: BC Managed Care – PPO | Admitting: Physical Therapy

## 2022-04-04 DIAGNOSIS — R278 Other lack of coordination: Secondary | ICD-10-CM | POA: Diagnosis not present

## 2022-04-04 DIAGNOSIS — M79604 Pain in right leg: Secondary | ICD-10-CM | POA: Diagnosis not present

## 2022-04-04 DIAGNOSIS — M6281 Muscle weakness (generalized): Secondary | ICD-10-CM | POA: Diagnosis not present

## 2022-04-04 NOTE — Patient Instructions (Signed)
  1) laying down supra-pubic massage/stretch: hand over hand technique, sink into skin and draw hands upward, then to each side at 45 deg angle, hold each direction for 10 seconds, repeat 3 times.  Do this 1-2 times daily.  2) standing stretches in straddle stance:  A) shallow side lunges band and forth x 20 (Iinside of thigh stretch)  B) equal weight bearing on both legs, squeeze buttocks, drift pelvis forwards and lean back slightly (front of hip stretch) - 5 reps, 5 seconds

## 2022-04-04 NOTE — Therapy (Signed)
OUTPATIENT PHYSICAL THERAPY TREATMENT NOTE   Patient Name: Hannah Ray MRN: [id number] DOB:May 04, 1976, 46 y.o., female Today's Date: 04/04/2022  PCP: Marrian Salvage, PA-C  REFERRING PROVIDER: Karie Soda, MD   END OF SESSION:   PT End of Session - 04/04/22 0750     Visit Number 2    Date for PT Re-Evaluation 06/19/22    Authorization Type [employer] approved 11 visits    Authorization Time Period 04/04/22 - 07/02/22    Authorization - Visit Number 1    Authorization - Number of Visits 11    PT Start Time 0800    PT Stop Time 0845    PT Time Calculation (min) 45 min    Activity Tolerance Patient tolerated treatment well    Behavior During Therapy WFL for tasks assessed/performed             Past Medical History:  Diagnosis Date   Asthma    Hypertension    IBS (irritable bowel syndrome)    PONV (postoperative nausea and vomiting)    Scope patch helps   Past Surgical History:  Procedure Laterality Date   CESAREAN SECTION  2002   w/ tubal ligation   CHOLECYSTECTOMY  1997   HERNIA REPAIR  2004   INGUINAL HERNIA REPAIR Bilateral 02/24/2022   Procedure: LAPAROSCOPIC ABDOMINAL LYSIS OF ADHESIONS, RIGHT TRIPLE NEURECTOMY, RIGHT FEMORAL L HERNIA REPAIR, LEFT INGUINAL HERNIA  REPAIR WITH MESH TAP BLOCK;  Surgeon: Karie Soda, MD;  Location: [facility];  Service: General;  Laterality: Bilateral;   LAPAROSCOPY N/A 02/24/2022   Procedure: LAPAROSCOPY DIAGNOSTIC;  Surgeon: Karie Soda, MD;  Location: [facility];  Service: General;  Laterality: N/A;   There are no problems to display for this patient.   REFERRING DIAG: R10.31 (ICD-10-CM) - Inguinal pain, right K41.90 (ICD-10-CM) - Unilateral femoral hernia, without obstruction or gangrene, not specified as recurrent K40.90 (ICD-10-CM) - Unilateral inguinal hernia, without obstruction or gangrene, not specified as recurrent N39.41 (ICD-10-CM) - Urge incontinence R20.0 (ICD-10-CM) - Anesthesia of  skin   THERAPY DIAG:  Muscle weakness (generalized)  Other lack of coordination  Pain in right leg  Rationale for Evaluation and Treatment Rehabilitation  PERTINENT HISTORY: C-section; hernia repair 2004 and bilateral inguinal hernia 02/24/2022, IBS; recent stroke in the past month, Right triple neurectomy 02/24/2022, being assessed for MS  PRECAUTIONS: recent stroke with short term memory loss; Bilateral hernia repair on 02/24/2022    URINATION Pain with urination: No Fully empty bladder: Yes:   Stream: Strong Urgency: No Frequency: urinates every 1-2 hours; able to start the urine but unable to stop it. No urge until she starts to leak and is new since the surgery.  Leakage:  she does not realize it.  Pads: Yes: 2-4 pads per day    PREGNANCY Vaginal deliveries 2 Tearing Yes: small amount C-section deliveries 1 Currently pregnant No  EVAL SUBJECTIVE:  She had surgery to repair for inguinal hernia. Patient took gabapentin and helped with her nerve pain. She is not able to bring right leg onto left leg. Patient had a recent stroke. 7 bulging disc in neck. She is going to see a Careers adviser for this. Walk 15 minutes at a time and gets tired if goes longer. They are still ruling out MS. Patient has a urologist appointment.  Fluid intake: Yes: water    TODAY'S SUBJECTIVE: I was walking last Wednesday and I felt a sensation like a tangle of rubber bands let go and like  everything went back to where it was supposed to be and I can now cross my Rt leg over Lt without difficulty.   I do get pain in inner thigh and top of the thigh after walking 15 min, feels like fatigue. My urinary accidents have reduced to only approx 2x in past week.  The PF contractions are helping.    PAIN:  Are you having pain? Yes NPRS scale: 4/10 Pain location:  right hip down to the lateral right knee.    Pain type: aching and tearing, tender Pain description: constant and the lateral right thigh is  intermittent.      Aggravating factors: walking more than 15 minutes Relieving factors: heat     OBJECTIVE:    DIAGNOSTIC FINDINGS:  none     COGNITION:            Overall cognitive status: Impaired      Trouble with short term memory and explaining things                     SENSATION:            Light touch: Deficits along the right lateral thigh            Proprioception: Appears intact     FUNCTIONAL TESTS:    GAIT: Distance walked: walked 20 feet Assistive device utilized: None Level of assistance: Complete Independence Comments: she will sometimes drag her right foot , decreased right hip flexion                POSTURE: No Significant postural limitations               PELVIC ALIGNMENT: in correct alignment   LUMBARAROM/PROM lumbar ROM is full   LOWER EXTREMITY ROM:   Active ROM Right eval Left eval  Hip flexion Active 80; passive 110 110  Hip internal rotation Passive 8 with end range pain    Hip external rotation Passive 40 with end range pain     (Blank rows = not tested)   LOWER EXTREMITY MMT:   MMT Right eval Left eval  Hip extension 4+/5    Hip abduction 3/5    Hip adduction 4/5       PALPATION:   General  surgical site in the umbilicus is restricted, tenderness along the c-section scar and where the patches were placed. Tightness in right hip adductor; tenderness located in the right thoracic lumbar area where the neurectomy was perfromed.                   Patient confirms identification and approves PT to assess internal pelvic floor and treatment No, deferred Patient reports she has placed her finger in her vaginal canal and felt a circular contraction    PELVIC MMT:   MMT eval  Vaginal    Internal Anal Sphincter    External Anal Sphincter    Puborectalis    Diastasis Recti    (Blank rows = not tested)           TODAY'S TREATMENT  04/04/22: Suprapubic fascial stretching and instruction for self-stretching 3-ways STM Rt ITB  and lateral quad, adductors proximally Supine single knee to chest from leg extended with knee hug x 10, 5 sec hold Supine stretches: bil butterfly, Rt fig 4, Rt piriformis Standing stretches: straddle stance, squeeze glutes, drift pelvis ant for hip flexor stretch; straddle stance gentle shallow side lunges for adductor mobility x 20 Counter march  alt x 20, hip ext Rt 2x10 HEP instructions including standing stretches, supine stretches and self-stretch to suprapubic fascia  EVAL Date: 03/27/2022 HEP established-see below      PATIENT EDUCATION:  Education details: Access Code: [email or web address] Person educated: Patient and Spouse Education method: Explanation, Demonstration, Tactile cues, Verbal cues, and Handouts Education comprehension: verbalized understanding, returned demonstration, verbal cues required, tactile cues required, and needs further education     HOME EXERCISE PROGRAM: 03/27/2022 Access Code: [email or web address] URL: [email or web address] Date: 04/04/2022 Prepared by: Loistine Simas Saben Donigan  Exercises - Seated Pelvic Floor Contraction  - 3 x daily - 7 x weekly - 1 sets - 10 reps - 5 sec hold - Seated Quick Flick Pelvic Floor Contractions  - 3 x daily - 7 x weekly - 1 sets - 5 reps - Supine Single Knee to Chest  - 2 x daily - 7 x weekly - 1 sets - 3 reps - 15 hold - Supine Butterfly Groin Stretch  - 2 x daily - 7 x weekly - 1 sets - 3 reps - 15 hold - Supine Figure 4 Piriformis Stretch  - 2 x daily - 7 x weekly - 1 sets - 3 reps - 15 hold - Supine Piriformis Stretch with Foot on Ground  - 2 x daily - 7 x weekly - 1 sets - 3 reps - 15 hold - Standing March with Counter Support  - 2 x daily - 7 x weekly - 1 sets - 20 reps - Standing Hip Extension with Counter Support  - 2 x daily - 7 x weekly - 2 sets - 10 reps   ASSESSMENT:   CLINICAL IMPRESSION: Patient is accompanied by her husband.  She reports something released in Rt lateral/anterior thigh on Wed when walking and she can  now cross her Rt leg over Lt.  She gets fatigue-related pain in Rt hip flexor and adductor with walking after approx 15'.  PT performed and instructed Pt in suprapubic fascial massage today, with tension noted Rt>Lt.  Pt with tightness in Rt IT band, lateral quad and proximal adductors so STM performed today to these areas as well.  HEP progressed for hip flexor and extensor strength, hip mobility and flexibility with good tolerance.  Handouts used for ease of HEP understanding and memory given recent stroke affecting short term memory.      OBJECTIVE IMPAIRMENTS decreased activity tolerance, decreased coordination, decreased endurance, difficulty walking, decreased ROM, decreased strength, increased fascial restrictions, increased muscle spasms, impaired sensation, and pain.    ACTIVITY LIMITATIONS continence, dressing, and locomotion level   PARTICIPATION LIMITATIONS: driving, shopping, and community activity   PERSONAL FACTORS C-section; hernia repair 2004 and bilateral inguinal hernia 02/24/2022, IBS; recent stroke in the past month, Right triple neurectomy 02/24/2022 are also affecting patient's functional outcome.    REHAB POTENTIAL: Good   CLINICAL DECISION MAKING: Evolving/moderate complexity   EVALUATION COMPLEXITY: Moderate     GOALS: Goals reviewed with patient? Yes   SHORT TERM GOALS: Target date: 04/24/2022   Patient is independent with initial HEP for hip stretches.  Baseline: Goal status: ongoing   2.  Patient is independent with pelvic floor exercises with contraction with exercise and holding for 5 sec.  Baseline:  Goal status: ongoing     LONG TERM GOALS: Target date: 06/19/2022    Patient is independent with advanced HEP for hip and pelvic floor strength.  Baseline:  Goal status: ongoing   2.  Patient is able to cross her right leg  over the left so she can put her shoes on due to increased in right hip external rotation >/= 60 degrees  Baseline: Rt hip ER 50 deg  on 8/22, Pt able to cross leg today Goal status: ongoing   3.  Patient has increased in right hip flexion actively >/= 105 degrees so she is able to lift her right leg to cross her leg over the left.  Baseline:  Goal status: INITIAL   4.  Patient report she is able to feel the urge to urinate so her urinary leakage improved >/= 50%.  Baseline:  Goal status: INITIAL       PLAN: PT FREQUENCY: 1-2x/week   PT DURATION: 12 weeks   PLANNED INTERVENTIONS: Therapeutic exercises, Therapeutic activity, Neuromuscular re-education, Balance training, Gait training, Patient/Family education, Self Care, Joint mobilization, Dry Needling, Electrical stimulation, Cryotherapy, Moist heat, scar mobilization, Biofeedback, and Manual therapy   PLAN FOR NEXT SESSION: hip stretches on the right, manual work to the hip adductors, work on right hip flexion and external rotation, gentle manual work to the right inguinal area, increase pelvic floor contraction to 5 seconds if she is able to. She has trouble with short term memory so needs HEP written down.     Danasha Melman, PT 04/04/22 12:35 PM

## 2022-04-06 DIAGNOSIS — G5622 Lesion of ulnar nerve, left upper limb: Secondary | ICD-10-CM | POA: Diagnosis not present

## 2022-04-06 DIAGNOSIS — Z6838 Body mass index (BMI) 38.0-38.9, adult: Secondary | ICD-10-CM | POA: Diagnosis not present

## 2022-04-06 DIAGNOSIS — M542 Cervicalgia: Secondary | ICD-10-CM | POA: Diagnosis not present

## 2022-04-06 DIAGNOSIS — N3941 Urge incontinence: Secondary | ICD-10-CM | POA: Diagnosis not present

## 2022-04-10 DIAGNOSIS — M5030 Other cervical disc degeneration, unspecified cervical region: Secondary | ICD-10-CM | POA: Insufficient documentation

## 2022-04-10 HISTORY — DX: Other cervical disc degeneration, unspecified cervical region: M50.30

## 2022-04-11 ENCOUNTER — Encounter: Payer: Self-pay | Admitting: Family Medicine

## 2022-04-11 ENCOUNTER — Ambulatory Visit (INDEPENDENT_AMBULATORY_CARE_PROVIDER_SITE_OTHER): Payer: BC Managed Care – PPO | Admitting: Family Medicine

## 2022-04-11 ENCOUNTER — Ambulatory Visit: Payer: BC Managed Care – PPO | Admitting: Physical Therapy

## 2022-04-11 VITALS — BP 128/78 | HR 75 | Temp 98.8°F | Ht 67.0 in | Wt 252.0 lb

## 2022-04-11 DIAGNOSIS — R202 Paresthesia of skin: Secondary | ICD-10-CM | POA: Diagnosis not present

## 2022-04-11 DIAGNOSIS — R4701 Aphasia: Secondary | ICD-10-CM | POA: Diagnosis not present

## 2022-04-11 DIAGNOSIS — R4189 Other symptoms and signs involving cognitive functions and awareness: Secondary | ICD-10-CM | POA: Diagnosis not present

## 2022-04-11 DIAGNOSIS — Z8673 Personal history of transient ischemic attack (TIA), and cerebral infarction without residual deficits: Secondary | ICD-10-CM

## 2022-04-11 DIAGNOSIS — B029 Zoster without complications: Secondary | ICD-10-CM

## 2022-04-11 DIAGNOSIS — R413 Other amnesia: Secondary | ICD-10-CM | POA: Diagnosis not present

## 2022-04-11 DIAGNOSIS — R6889 Other general symptoms and signs: Secondary | ICD-10-CM | POA: Diagnosis not present

## 2022-04-11 LAB — BAYER DCA HB A1C WAIVED: HB A1C (BAYER DCA - WAIVED): 5.4 % (ref 4.8–5.6)

## 2022-04-11 MED ORDER — VALACYCLOVIR HCL 1 G PO TABS: 1000.0000 mg | ORAL_TABLET | Freq: Three times a day (TID) | ORAL | 0 refills | Status: AC

## 2022-04-11 NOTE — Progress Notes (Signed)
Subjective:  Patient ID: Hannah Ray, female    DOB: 1975-09-05, 46 y.o.   MRN: [id number]  Patient Care Team: Baruch Gouty, FNP as PCP - General (Family Medicine) Michael Boston, MD as Consulting Physician (General Surgery) Nelwyn Salisbury, [place] as Physician Assistant (Obstetrics and Gynecology) Amie Portland, MD as Consulting Physician (Neurology)   Chief Complaint:  New Patient (Initial Visit) (Recent ED visit [facility] for stroke/Recent MRI's)   HPI: Hannah Ray is a 46 y.o. female presenting on 04/11/2022 for New Patient (Initial Visit) (Recent ED visit /Concern for stroke/Recent MRI's)   Pt presents today to establish care with new PCP and for evaluation of ongoing neurological symptoms post surgery 02/24/2022. Pt has a complicated medical history. She had an open hernia repair in 2007 which caused ongoing RLQ and right groin pain. She was eventually referred to Dr. Johney Maine for exploratory laparoscopic surgery which she had on 02/24/2022. During procedure she had right femoral hernia repair, left inguinal hernia repair, lysis of adhesions, and right triple neurectomy. States after this surgery she developed left sided weakness, memory loss, aphasia, brain fog, and headaches when concentrating. She went to the ED on 03/22/2022. 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 She was referred to neurosurgery and neurology. Has appointments scheduled in November. She continues to have issues with speech and putting thoughts together, left sided weakness, coordination issues, and memory loss.  She also reports a new rash to her left abdomen and left forearm. Started 3 days ago, is red and blistering, minimally pruritic.      Relevant past medical, surgical, family, and social history reviewed and updated as indicated.  Allergies and medications reviewed and updated. Data reviewed: Chart  in Epic.   Past Medical History:  Diagnosis Date   Asthma    Hypertension    IBS (irritable bowel syndrome)    PONV (postoperative nausea and vomiting)    Scope patch helps   Stroke [facility])     Past Surgical History:  Procedure Laterality Date   CESAREAN SECTION  2002   w/ tubal ligation   CHOLECYSTECTOMY  1997   HERNIA REPAIR  2004   INGUINAL HERNIA REPAIR Bilateral 02/24/2022   Procedure: LAPAROSCOPIC ABDOMINAL LYSIS OF ADHESIONS, RIGHT TRIPLE NEURECTOMY, RIGHT FEMORAL L HERNIA REPAIR, LEFT INGUINAL HERNIA  REPAIR WITH MESH TAP BLOCK;  Surgeon: Michael Boston, MD;  Location: [facility];  Service: General;  Laterality: Bilateral;   LAPAROSCOPY N/A 02/24/2022   Procedure: LAPAROSCOPY DIAGNOSTIC;  Surgeon: Michael Boston, MD;  Location: [place];  Service: General;  Laterality: N/A;   TUBAL LIGATION      Social History   Socioeconomic History   Marital status: Married    Spouse name: Not on file   Number of children: Not on file   Years of education: Not on file   Highest education level: Not on file  Occupational History   Not on file  Tobacco Use   Smoking status: Never   Smokeless tobacco: Never  Vaping Use   Vaping Use: Every day   Substances: Nicotine  Substance and Sexual Activity   Alcohol use: Never   Drug use: Never   Sexual activity: Yes  Other Topics Concern   Not on file  Social History Narrative   Not on file   Social Determinants of Health   Financial Resource Strain: Not on file  Food Insecurity: Not on file  Transportation Needs: Not on file  Physical Activity: Not on file  Stress: Not on file  Social Connections: Not on file  Intimate Partner Violence: Not on file    Outpatient Encounter Medications as of 04/11/2022  Medication Sig   acetaminophen (TYLENOL) 325 MG tablet Take 2 tablets every 6 hours by oral route.   albuterol (VENTOLIN HFA) 108 (90 Base) MCG/ACT inhaler Inhale into the lungs every 6 (six)  hours as needed for wheezing or shortness of breath.   Cholecalciferol (VITAMIN D-1000 MAX ST) 25 MCG (1000 UT) tablet Take by mouth.   EC-NAPROXEN 500 MG EC tablet Take 500 mg by mouth 2 (two) times daily as needed.   gabapentin (NEURONTIN) 100 MG capsule Take 100 mg by mouth 3 (three) times daily.   hydrochlorothiazide (HYDRODIURIL) 25 MG tablet Take 25 mg by mouth daily.   valACYclovir (VALTREX) 1000 MG tablet Take 1 tablet (1,000 mg total) by mouth 3 (three) times daily for 7 days.   valsartan (DIOVAN) 40 MG tablet Take 40 mg by mouth daily.   [DISCONTINUED] ibuprofen (ADVIL) 800 MG tablet Take 1 tablet 3 times a day by oral route.   [DISCONTINUED] methocarbamol (ROBAXIN) 500 MG tablet Take 500 mg by mouth every 8 (eight) hours as needed.   [DISCONTINUED] NON FORMULARY Heather's tummy tamer + enzyme to help with IBS   [DISCONTINUED] traMADol (ULTRAM) 50 MG tablet Take 1 tablet by mouth every 6 (six) hours as needed.   [DISCONTINUED] traZODone (DESYREL) 50 MG tablet TAKE 1 TABLET BY MOUTH EVERY DAY AT DINNER   [DISCONTINUED] methocarbamol (ROBAXIN) 750 MG tablet Take 1 tablet (750 mg total) by mouth 4 (four) times daily as needed (use for muscle cramps/pain).   [DISCONTINUED] traMADol (ULTRAM) 50 MG tablet Take 1-2 tablets (50-100 mg total) by mouth every 6 (six) hours as needed for moderate pain or severe pain. (Patient not taking: Reported on 04/11/2022)   No facility-administered encounter medications on file as of 04/11/2022.    Allergies  Allergen Reactions   Lisinopril Swelling   Mobic [Meloxicam] Swelling    Leg swelling. OK to take ibuprofen/aleve   Norvasc [Amlodipine] Swelling   Tamiflu [Oseltamivir] Hives   Tape Dermatitis    Clear transpore ok   Tetracycline Other (See Comments)    Breakthrough bleeding>   Trazodone Hives    Review of Systems  Constitutional:  Positive for activity change, appetite change and fatigue. Negative for chills, diaphoresis, fever and  unexpected weight change.  HENT: Negative.    Eyes: Negative.  Negative for photophobia and visual disturbance.  Respiratory:  Negative for cough, chest tightness and shortness of breath.   Cardiovascular:  Negative for chest pain, palpitations and leg swelling.  Gastrointestinal:  Negative for abdominal pain, blood in stool, constipation, diarrhea, nausea and vomiting.  Endocrine: Negative.  Negative for polydipsia, polyphagia and polyuria.  Genitourinary:  Negative for decreased urine volume, difficulty urinating, dysuria, frequency and urgency.  Musculoskeletal:  Negative for arthralgias and myalgias.  Skin: Negative.   Allergic/Immunologic: Negative.   Neurological:  Positive for speech difficulty, weakness, numbness and headaches. Negative for tremors, seizures, syncope, facial asymmetry and light-headedness.  Hematological: Negative.  Negative for adenopathy.  Psychiatric/Behavioral:  Negative for confusion, hallucinations, sleep disturbance and suicidal ideas.   All other systems reviewed and are negative.       Objective:  BP 128/78   Pulse 75   Temp 98.8 F (37.1 C)   Ht _0  (  1.702 m)   Wt 252 lb (114.3 kg)   LMP 04/08/2022 (Exact Date) Comment: started  SpO2 95%   BMI 39.47 kg/m    Wt Readings from Last 3 Encounters:  04/11/22 252 lb (114.3 kg)  03/22/22 250 lb (113.4 kg)  02/24/22 249 lb 6.4 oz (113.1 kg)    Physical Exam Vitals and nursing note reviewed.  Constitutional:      General: She is not in acute distress.    Appearance: Normal appearance. She is obese. She is not ill-appearing, toxic-appearing or diaphoretic.  HENT:     Head: Normocephalic and atraumatic.     Right Ear: Tympanic membrane, ear canal and external ear normal.     Left Ear: Tympanic membrane, ear canal and external ear normal.     Nose: Nose normal.     Mouth/Throat:     Mouth: Mucous membranes are moist.     Pharynx: Oropharynx is clear.  Eyes:     Conjunctiva/sclera:  Conjunctivae normal.     Pupils: Pupils are equal, round, and reactive to light.  Neck:     Vascular: No carotid bruit.  Cardiovascular:     Rate and Rhythm: Normal rate and regular rhythm.     Heart sounds: Normal heart sounds. No murmur heard.    No friction rub. No gallop.  Pulmonary:     Effort: Pulmonary effort is normal.     Breath sounds: Normal breath sounds.  Musculoskeletal:     Cervical back: Normal range of motion and neck supple. No rigidity or tenderness.     Right lower leg: No edema.     Left lower leg: No edema.  Lymphadenopathy:     Cervical: No cervical adenopathy.  Skin:    General: Skin is warm and dry.     Capillary Refill: Capillary refill takes less than 2 seconds.     Findings: Erythema and rash present. Rash is papular and vesicular.       Neurological:     Mental Status: She is alert and oriented to person, place, and time.     GCS: GCS eye subscore is 4. GCS verbal subscore is 5. GCS motor subscore is 6.     Cranial Nerves: Cranial nerves 2-12 are intact.     Motor: Weakness (left grip less than right, left leg slighlty weaker than right, 4/5) present. No tremor, atrophy, abnormal muscle tone, seizure activity or pronator drift.     Coordination: Coordination is intact.     Gait: Gait is intact.  Psychiatric:        Mood and Affect: Mood normal.        Behavior: Behavior normal.        Thought Content: Thought content normal.        Judgment: Judgment normal.     Results for orders placed or performed during the hospital encounter of 03/22/22  Resp Panel by RT-PCR (Flu A&B, Covid) Anterior Nasal Swab   Specimen: Anterior Nasal Swab  Result Value Ref Range   SARS Coronavirus 2 by RT PCR NEGATIVE NEGATIVE   Influenza A by PCR NEGATIVE NEGATIVE   Influenza B by PCR NEGATIVE NEGATIVE  Protime-INR  Result Value Ref Range   Prothrombin Time 13.3 11.4 - 15.2 seconds   INR 1.0 0.8 - 1.2  APTT  Result Value Ref Range   aPTT 32 24 - 36 seconds   CBC  Result Value Ref Range   WBC 7.1 4.0 - 10.5 K/uL   RBC  4.52 3.87 - 5.11 MIL/uL   Hemoglobin 13.0 12.0 - 15.0 g/dL   HCT 38.5 36.0 - 46.0 %   MCV 85.2 80.0 - 100.0 fL   MCH 28.8 26.0 - 34.0 pg   MCHC 33.8 30.0 - 36.0 g/dL   RDW 13.3 11.5 - 15.5 %   Platelets 269 150 - 400 K/uL   nRBC 0.0 0.0 - 0.2 %  Differential  Result Value Ref Range   Neutrophils Relative % 60 %   Neutro Abs 4.2 1.7 - 7.7 K/uL   Lymphocytes Relative 28 %   Lymphs Abs 2.0 0.7 - 4.0 K/uL   Monocytes Relative 6 %   Monocytes Absolute 0.4 0.1 - 1.0 K/uL   Eosinophils Relative 5 %   Eosinophils Absolute 0.4 0.0 - 0.5 K/uL   Basophils Relative 1 %   Basophils Absolute 0.1 0.0 - 0.1 K/uL   Immature Granulocytes 0 %   Abs Immature Granulocytes 0.02 0.00 - 0.07 K/uL  Comprehensive metabolic panel  Result Value Ref Range   Sodium 140 135 - 145 mmol/L   Potassium 3.1 (L) 3.5 - 5.1 mmol/L   Chloride 102 98 - 111 mmol/L   CO2 27 22 - 32 mmol/L   Glucose, Bld 118 (H) 70 - 99 mg/dL   BUN 14 6 - 20 mg/dL   Creatinine, Ser 0.62 0.44 - 1.00 mg/dL   Calcium 9.4 8.9 - 10.3 mg/dL   Total Protein 7.3 6.5 - 8.1 g/dL   Albumin 4.5 3.5 - 5.0 g/dL   AST 15 15 - 41 U/L   ALT 19 0 - 44 U/L   Alkaline Phosphatase 42 38 - 126 U/L   Total Bilirubin 0.5 0.3 - 1.2 mg/dL   GFR, Estimated >60 >60 mL/min   Anion gap 11 5 - 15  Ethanol  Result Value Ref Range   Alcohol, Ethyl (B) <10 <10 mg/dL  Pregnancy, urine  Result Value Ref Range   Preg Test, Ur NEGATIVE NEGATIVE  CBG monitoring, ED  Result Value Ref Range   Glucose-Capillary 117 (H) 70 - 99 mg/dL       Pertinent labs & imaging results that were available during my care of the patient were reviewed by me and considered in my medical decision making.  Assessment & Plan:  Sherrilynn was seen today for new patient (initial visit).  Diagnoses and all orders for this visit:  History of stroke Paresthesia Brain fog Expressive aphasia Memory loss Ongoing since  procedure on 02/24/2022. Will place urgent referral to neurology as pt has yet to get established. Will check labs for potential deficiencies which could be contributory. Has not been able to return to work due to symptoms. Will refer to PT and OT for evaluation and treatment. -     CMP14+EGFR -     CBC with Differential/Platelet -     Bayer DCA Hb A1c Waived -     Thyroid Panel With TSH -     Vitamin B12 -     VITAMIN D 25 Hydroxy (Vit-D Deficiency, Fractures) -     Ambulatory referral to Neurology -     Ambulatory referral to Physical Therapy -     Ambulatory referral to Occupational Therapy  Herpes zoster without complication Classic shingles rash. Valtrex as prescribed. Report new, worsening, or persistent symptoms.  -     valACYclovir (VALTREX) 1000 MG tablet; Take 1 tablet (1,000 mg total) by mouth 3 (three) times daily for 7 days.   Continue  all other maintenance medications.  Follow up plan: Return in about 4 weeks (around 05/09/2022), or if symptoms worsen or fail to improve.  Total time spent with patient 65  Greater than 50% of encounter spent in coordination of care/counseling. EHR reviewed in detail.   Continue healthy lifestyle choices, including diet (rich in fruits, vegetables, and lean proteins, and low in salt and simple carbohydrates) and exercise (at least 30 minutes of moderate physical activity daily).  Educational handout given for shingles  The above assessment and management plan was discussed with the patient. The patient verbalized understanding of and has agreed to the management plan. Patient is aware to call the clinic if they develop any new symptoms or if symptoms persist or worsen. Patient is aware when to return to the clinic for a follow-up visit. Patient educated on when it is appropriate to go to the emergency department.   Monia Pouch, FNP-C [facility] [phone]

## 2022-04-12 LAB — CMP14+EGFR
ALT: 20 IU/L (ref 0–32)
AST: 19 IU/L (ref 0–40)
Albumin/Globulin Ratio: 2 (ref 1.2–2.2)
Albumin: 4.4 g/dL (ref 3.9–4.9)
Alkaline Phosphatase: 52 IU/L (ref 44–121)
BUN/Creatinine Ratio: 20 (ref 9–23)
BUN: 12 mg/dL (ref 6–24)
Bilirubin Total: 0.4 mg/dL (ref 0.0–1.2)
CO2: 25 mmol/L (ref 20–29)
Calcium: 9.6 mg/dL (ref 8.7–10.2)
Chloride: 100 mmol/L (ref 96–106)
Creatinine, Ser: 0.61 mg/dL (ref 0.57–1.00)
Globulin, Total: 2.2 g/dL (ref 1.5–4.5)
Glucose: 94 mg/dL (ref 70–99)
Potassium: 3.7 mmol/L (ref 3.5–5.2)
Sodium: 140 mmol/L (ref 134–144)
Total Protein: 6.6 g/dL (ref 6.0–8.5)
eGFR: 112 mL/min/{1.73_m2} (ref >59)

## 2022-04-12 LAB — CBC WITH DIFFERENTIAL/PLATELET
Basophils Absolute: 0.1 10*3/uL (ref 0.0–0.2)
Basos: 1 %
EOS (ABSOLUTE): 0.4 10*3/uL (ref 0.0–0.4)
Eos: 6 %
Hematocrit: 39.6 % (ref 34.0–46.6)
Hemoglobin: 13.3 g/dL (ref 11.1–15.9)
Immature Grans (Abs): 0 10*3/uL (ref 0.0–0.1)
Immature Granulocytes: 0 %
Lymphocytes Absolute: 1.8 10*3/uL (ref 0.7–3.1)
Lymphs: 29 %
MCH: 29.2 pg (ref 26.6–33.0)
MCHC: 33.6 g/dL (ref 31.5–35.7)
MCV: 87 fL (ref 79–97)
Monocytes Absolute: 0.5 10*3/uL (ref 0.1–0.9)
Monocytes: 8 %
Neutrophils Absolute: 3.4 10*3/uL (ref 1.4–7.0)
Neutrophils: 56 %
Platelets: 224 10*3/uL (ref 150–450)
RBC: 4.56 x10E6/uL (ref 3.77–5.28)
RDW: 13.8 % (ref 11.7–15.4)
WBC: 6.1 10*3/uL (ref 3.4–10.8)

## 2022-04-12 LAB — THYROID PANEL WITH TSH
Free Thyroxine Index: 1.9 (ref 1.2–4.9)
T3 Uptake Ratio: 24 % (ref 24–39)
T4, Total: 8.1 ug/dL (ref 4.5–12.0)
TSH: 1.44 u[IU]/mL (ref 0.450–4.500)

## 2022-04-12 LAB — VITAMIN D 25 HYDROXY (VIT D DEFICIENCY, FRACTURES): Vit D, 25-Hydroxy: 40.9 ng/mL (ref 30.0–100.0)

## 2022-04-12 LAB — VITAMIN B12: Vitamin B-12: 523 pg/mL (ref 232–1245)

## 2022-04-13 ENCOUNTER — Encounter: Payer: BC Managed Care – PPO | Admitting: Physical Therapy

## 2022-04-14 ENCOUNTER — Telehealth: Payer: Self-pay | Admitting: Family Medicine

## 2022-04-14 NOTE — Telephone Encounter (Signed)
Please call patient with update on urgent referral.

## 2022-04-14 NOTE — Telephone Encounter (Signed)
Contacted patient and suggested that she should her from them sometime early next week.  She verbalized understanding

## 2022-04-19 ENCOUNTER — Ambulatory Visit: Payer: BC Managed Care – PPO | Admitting: Physical Therapy

## 2022-04-25 ENCOUNTER — Ambulatory Visit: Payer: BC Managed Care – PPO | Admitting: Rehabilitative and Restorative Service Providers"

## 2022-04-25 ENCOUNTER — Encounter: Payer: Self-pay | Admitting: Physical Therapy

## 2022-04-25 ENCOUNTER — Ambulatory Visit: Payer: BC Managed Care – PPO | Attending: Surgery | Admitting: Physical Therapy

## 2022-04-25 DIAGNOSIS — R41840 Attention and concentration deficit: Secondary | ICD-10-CM | POA: Insufficient documentation

## 2022-04-25 DIAGNOSIS — R4701 Aphasia: Secondary | ICD-10-CM | POA: Diagnosis not present

## 2022-04-25 DIAGNOSIS — R278 Other lack of coordination: Secondary | ICD-10-CM | POA: Diagnosis not present

## 2022-04-25 DIAGNOSIS — R202 Paresthesia of skin: Secondary | ICD-10-CM | POA: Insufficient documentation

## 2022-04-25 DIAGNOSIS — R4189 Other symptoms and signs involving cognitive functions and awareness: Secondary | ICD-10-CM | POA: Insufficient documentation

## 2022-04-25 DIAGNOSIS — R413 Other amnesia: Secondary | ICD-10-CM | POA: Diagnosis not present

## 2022-04-25 DIAGNOSIS — Z8673 Personal history of transient ischemic attack (TIA), and cerebral infarction without residual deficits: Secondary | ICD-10-CM | POA: Diagnosis not present

## 2022-04-25 DIAGNOSIS — M79604 Pain in right leg: Secondary | ICD-10-CM | POA: Diagnosis not present

## 2022-04-25 DIAGNOSIS — M6281 Muscle weakness (generalized): Secondary | ICD-10-CM | POA: Insufficient documentation

## 2022-04-25 NOTE — Therapy (Signed)
OUTPATIENT PHYSICAL THERAPY TREATMENT NOTE   Patient Name: Hannah Ray MRN: [id number] DOB:1975/10/01, 46 y.o., female Today's Date: 04/25/2022  PCP: Aldean Ast, PA-C  REFERRING PROVIDER: Michael Boston, MD   END OF SESSION:   PT End of Session - 04/25/22 0758     Visit Number 3    Date for PT Re-Evaluation 06/19/22    Authorization Type [employer] approved 11 visits    Authorization Time Period 04/04/22 - 07/02/22    Authorization - Visit Number 2    Authorization - Number of Visits 11    PT Start Time 0800    PT Stop Time 0845    PT Time Calculation (min) 45 min    Activity Tolerance Patient tolerated treatment well    Behavior During Therapy WFL for tasks assessed/performed              Past Medical History:  Diagnosis Date   Asthma    Hypertension    IBS (irritable bowel syndrome)    PONV (postoperative nausea and vomiting)    Scope patch helps   Stroke [facility])    Past Surgical History:  Procedure Laterality Date   CESAREAN SECTION  2002   w/ tubal ligation   CHOLECYSTECTOMY  1997   HERNIA REPAIR  2004   INGUINAL HERNIA REPAIR Bilateral 02/24/2022   Procedure: LAPAROSCOPIC ABDOMINAL LYSIS OF ADHESIONS, RIGHT TRIPLE NEURECTOMY, RIGHT FEMORAL L HERNIA REPAIR, LEFT INGUINAL HERNIA  REPAIR WITH MESH TAP BLOCK;  Surgeon: Michael Boston, MD;  Location: [place];  Service: General;  Laterality: Bilateral;   LAPAROSCOPY N/A 02/24/2022   Procedure: LAPAROSCOPY DIAGNOSTIC;  Surgeon: Michael Boston, MD;  Location: [place];  Service: General;  Laterality: N/A;   TUBAL LIGATION     Patient Active Problem List   Diagnosis Date Noted   Bulging of cervical intervertebral disc 04/10/2022   BMI 35.0-35.9,adult 12/28/2021   Inguinal pain, right 12/28/2021   Anxiety 08/18/2021   Asthma 08/18/2021   Essential (primary) hypertension 08/18/2021   Vitamin D deficiency 08/18/2021    REFERRING DIAG: R10.31 (ICD-10-CM) - Inguinal pain, right  K41.90 (ICD-10-CM) - Unilateral femoral hernia, without obstruction or gangrene, not specified as recurrent K40.90 (ICD-10-CM) - Unilateral inguinal hernia, without obstruction or gangrene, not specified as recurrent N39.41 (ICD-10-CM) - Urge incontinence R20.0 (ICD-10-CM) - Anesthesia of skin   THERAPY DIAG:  Muscle weakness (generalized)  Other lack of coordination  Pain in right leg  Rationale for Evaluation and Treatment Rehabilitation  PERTINENT HISTORY: C-section; hernia repair 2004 and bilateral inguinal hernia 02/24/2022, IBS; recent stroke in the past month, Right triple neurectomy 02/24/2022, being assessed for MS  PRECAUTIONS: recent stroke with short term memory loss; Bilateral hernia repair on 02/24/2022    URINATION Pain with urination: No Fully empty bladder: Yes:   Stream: Strong Urgency: No Frequency: urinates every 1-2 hours; able to start the urine but unable to stop it. No urge until she starts to leak and is new since the surgery.  Leakage:  she does not realize it.  Pads: Yes: 2-4 pads per day    PREGNANCY Vaginal deliveries 2 Tearing Yes: small amount C-section deliveries 1 Currently pregnant No  EVAL SUBJECTIVE:  She had surgery to repair for inguinal hernia. Patient took gabapentin and helped with her nerve pain. She is not able to bring right leg onto left leg. Patient had a recent stroke. 7 bulging disc in neck. She is going to see a Psychologist, sport and exercise for this. Walk  15 minutes at a time and gets tired if goes longer. They are still ruling out MS. Patient has a urologist appointment.  Fluid intake: Yes: water    TODAY'S SUBJECTIVE: I got shingles along lower and Lt abdomen, Lt arm, both legs.  I had to cancel several appointments.  I wasn't able to do lower abdominal stretches due to shingles.  I have no pain or discomfort in hip unless I wear tight panties right across the IT band, no pain with granny panties. When I had shingles I couldn't go for my walks but I  used my floor pedal bike for an hour at a time if I was having a good day. The release I had weeks ago is still improved and I can continue to cross my legs easily. I was put on new meds for bladder spasms and my urgency has significantly improved.  MD thinks I don't feel them given my history   PAIN:  Are you having pain? Yes NPRS scale: 3/10 but only when wearing tight underwear across IT Band Pain location:  right hip down to the lateral right knee.    Pain type: aching, tender Pain description: intermittent.      Aggravating factors: walking more than 15 minutes Relieving factors: heat   OBJECTIVE:    DIAGNOSTIC FINDINGS:  none     COGNITION:            Overall cognitive status: Impaired      Trouble with short term memory and explaining things                     SENSATION:            Light touch: Deficits along the right lateral thigh            Proprioception: Appears intact     FUNCTIONAL TESTS:    GAIT: Distance walked: walked 20 feet Assistive device utilized: None Level of assistance: Complete Independence Comments: she will sometimes drag her right foot , decreased right hip flexion                POSTURE: No Significant postural limitations               PELVIC ALIGNMENT: in correct alignment   LUMBARAROM/PROM lumbar ROM is full   LOWER EXTREMITY ROM:   Active ROM Right eval Left eval  Hip flexion Active 80; passive 110 110  Hip internal rotation Passive 8 with end range pain    Hip external rotation Passive 40 with end range pain     (Blank rows = not tested)   LOWER EXTREMITY MMT:   MMT Right eval Left eval  Hip extension 4+/5    Hip abduction 3/5    Hip adduction 4/5       PALPATION:   General  surgical site in the umbilicus is restricted, tenderness along the c-section scar and where the patches were placed. Tightness in right hip adductor; tenderness located in the right thoracic lumbar area where the neurectomy was perfromed.                    Patient confirms identification and approves PT to assess internal pelvic floor and treatment No, deferred Patient reports she has placed her finger in her vaginal canal and felt a circular contraction    PELVIC MMT:   MMT eval  Vaginal    Internal Anal Sphincter    External Anal Sphincter  Puborectalis    Diastasis Recti    (Blank rows = not tested)           TODAY'S TREATMENT  04/25/22: Nustep L3 x 9' seat 10 arms 10, PT present to discuss symptoms Standing stretches: straddle stance, squeeze glutes, drift pelvis ant for hip flexor stretch; straddle stance gentle shallow side lunges for adductor mobility x 20 High knee march with toe tap to 3rd step x 10 Progress Rt hip flexor stretch to foot on 2nd step with Rt arm overhead/Lt trunk SB 3x15" (HEP) Seated ball squeeze with PF contraction x 3 breaths, 10 reps Sit to stand with PF contraction: able to perform 4 reps with prolonged lift before fatigue Standing Rt IT Band stretch with overhead reach in doorframe 3x15" (HEP) Supine Rt IT Band stretch Lt leg crossed over Rt 3x15" (HEP) Supine butterfly stretch x 30" bil Manual therapy: Rt proximal adductor STM, proximal ITB/TFL STM   04/04/22: Suprapubic fascial stretching and instruction for self-stretching 3-ways STM Rt ITB and lateral quad, adductors proximally Supine single knee to chest from leg extended with knee hug x 10, 5 sec hold Supine stretches: bil butterfly, Rt fig 4, Rt piriformis Standing stretches: straddle stance, squeeze glutes, drift pelvis ant for hip flexor stretch; straddle stance gentle shallow side lunges for adductor mobility x 20 Counter march alt x 20, hip ext Rt 2x10 HEP instructions including standing stretches, supine stretches and self-stretch to suprapubic fascia  EVAL Date: 03/27/2022 HEP established-see below      PATIENT EDUCATION:  Education details: Access Code: [phone] Person educated: Patient and Spouse Education method:  Explanation, Demonstration, Tactile cues, Verbal cues, and Handouts Education comprehension: verbalized understanding, returned demonstration, verbal cues required, tactile cues required, and needs further education     HOME EXERCISE PROGRAM: 03/27/2022 Access Code: [email or web address] URL: [email or web address] Date: 04/04/2022 Prepared by: Venetia Night Dudley Cooley  Exercises - Seated Pelvic Floor Contraction  - 3 x daily - 7 x weekly - 1 sets - 10 reps - 5 sec hold - Seated Quick Flick Pelvic Floor Contractions  - 3 x daily - 7 x weekly - 1 sets - 5 reps - Supine Single Knee to Chest  - 2 x daily - 7 x weekly - 1 sets - 3 reps - 15 hold - Supine Butterfly Groin Stretch  - 2 x daily - 7 x weekly - 1 sets - 3 reps - 15 hold - Supine Figure 4 Piriformis Stretch  - 2 x daily - 7 x weekly - 1 sets - 3 reps - 15 hold - Supine Piriformis Stretch with Foot on Ground  - 2 x daily - 7 x weekly - 1 sets - 3 reps - 15 hold - Standing March with Counter Support  - 2 x daily - 7 x weekly - 1 sets - 20 reps - Standing Hip Extension with Counter Support  - 2 x daily - 7 x weekly - 2 sets - 10 reps   ASSESSMENT:   CLINICAL IMPRESSION: Patient is accompanied by her husband.  She missed several appointments due to shingles which also prevented HEP and as much walking as she had been doing.  She has walked up to 1 hour without symptoms, can easily cross Rt leg over left, and can march to 105 deg hip flexion on Rt, meeting several goals.  She feels ITB/TFL pain with wearing tight underwear across Rt side but no pain with "granny panties".  PT progressed ITB and hip flexor stretches for HEP  today.  Pt is anxious to progress hip abd/flexor SLR but does have discomfort with these today with trial.  PT explained length before strength so stretching focus for now.  Pt has signif less urge/leakage and is able to contract PF with TA with functional activities such as sit to stand reps and gait distances.  She is on new  bladder med for bladder spasms.   OBJECTIVE IMPAIRMENTS decreased activity tolerance, decreased coordination, decreased endurance, difficulty walking, decreased ROM, decreased strength, increased fascial restrictions, increased muscle spasms, impaired sensation, and pain.    ACTIVITY LIMITATIONS continence, dressing, and locomotion level   PARTICIPATION LIMITATIONS: driving, shopping, and community activity   PERSONAL FACTORS C-section; hernia repair 2004 and bilateral inguinal hernia 02/24/2022, IBS; recent stroke in the past month, Right triple neurectomy 02/24/2022 are also affecting patient's functional outcome.    REHAB POTENTIAL: Good   CLINICAL DECISION MAKING: Evolving/moderate complexity   EVALUATION COMPLEXITY: Moderate     GOALS: Goals reviewed with patient? Yes   SHORT TERM GOALS: Target date: 04/24/2022   Patient is independent with initial HEP for hip stretches.  Baseline: Goal status: ongoing   2.  Patient is independent with pelvic floor exercises with contraction with exercise and holding for 5 sec.  Baseline:  Goal status: met     LONG TERM GOALS: Target date: 06/19/2022    Patient is independent with advanced HEP for hip and pelvic floor strength.  Baseline:  Goal status: ongoing   2.  Patient is able to cross her right leg over the left so she can put her shoes on due to increased in right hip external rotation >/= 60 degrees  Baseline: Rt hip ER 50 deg on 8/22, Pt able to cross leg today Goal status: ongoing   3.  Patient has increased in right hip flexion actively >/= 105 degrees so she is able to lift her right leg to cross her leg over the left.  Baseline:  Goal status: MET   4.  Patient report she is able to feel the urge to urinate so her urinary leakage improved >/= 50%.  Baseline:  Goal status: MET, ON NEW MEDS       PLAN: PT FREQUENCY: 1-2x/week   PT DURATION: 12 weeks   PLANNED INTERVENTIONS: Therapeutic exercises, Therapeutic  activity, Neuromuscular re-education, Balance training, Gait training, Patient/Family education, Self Care, Joint mobilization, Dry Needling, Electrical stimulation, Cryotherapy, Moist heat, scar mobilization, Biofeedback, and Manual therapy   PLAN FOR NEXT SESSION: STM/stretching proximal Rt quad, ITB/TFL, Rt inguinal and umbilical mobility, progress Rt hip strength as tol, review HEP, short term memory loss due to recent stroke, needs handouts for [employer], PT 04/25/22 8:51 AM

## 2022-04-25 NOTE — Progress Notes (Addendum)
NEUROLOGY CONSULTATION NOTE  Hannah Ray MRN: [id number] DOB: 23-Jul-1976  Referring provider: Ernie Avena, MD (ED referral) Primary care provider: Gilford Silvius, FNP  Reason for consult:  word-finding difficulty , left arm numbness, stroke  Assessment/Plan:   Memory/cognitive deficits - unclear etiology.  There was no reported episode of hypotension during the surgery.  After all this time, it cannot be due to anesthesia.  MRI of brain and labs do not point to any explainable etiology.   Left arm numbness with onset following surgery -consider ulnar neuropathy due to compression during the procedure.   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  Would not explain her symptoms.  Unknown when this occurred.  It could have occurred while in the womb.  In light of otherwise normal brain MRI and no current risk factors, I don't think further testing or initiation of ASA is warranted.  Order neuropsychological evaluation Order NCV-EMG of left upper extremity Follow up after testing.  Further recommendations pending results.     Subjective:  Hannah Ray is a 46 year old female with HTN, IBS and asthma who presents for multiple neurologic symptoms and stroke.  History supplemented by ED and PCP notes.  MRI of brain and cervical spine personally reviewed.  She is accompanied by her husband who also provides collateral history.  Patient had exploratory laparoscopic surgery on 02/24/2022 for chronic inguinal pain in which she underwent right triple neurectomy and lysis of adhesions, right femoral hernia repair, and left inguinal hernia repair.  When she woke up from surgery, her left arm was numb and she reported cognitive problems.  She was seen in the ED on 03/22/2022 for further evaluation.  MRI of brain without contrast showed small chronic left cerebellar infarct but otherwise unremarkable.  MRI of cervical spine showed showed central to left paracentral disc protrusions at C2-3  through C5-6 with mild spinal stenosis and disc bulge causing mild left C5 and C6 foraminal stenosis but no abnormalities involving the spinal cord.  She followed up with her PCP who performed further lab workup workup including vit D 25-hydroxy 40.9, normal CBC, normal CMP, TSH 1.440, B12 523, Hgb A1c 5.4.  She continues to have intermittent numbness in the left arm involving the last three digits and radiating up to the shoulder.  It is not positional.  No pain.  She does feel like she has decreased dexterity and drops objects.  Reports difficulty typing.  She saw neurosurgery who told her that the symptoms are not coming from her neck.  She did have shingles involving the left arm, but that occurred 2 months later.  She continues to have cognitive problems.  She has memory deficits.  She also endorses gaps in her memory.  She cannot remember many events over the last 5 years.  At work, she actually doesn't recognize coworkers ADDENDUM:  Note, that she doesn't recognize coworkers but she hasn't been able to return to work as well.  She cannot concentrate or multitask.  She reports word-finding difficulty.  She is able to follow when reading technical literature or news but cannot comprehend when reading fiction.  She has difficulty remembering how to operate her computer.  It has affected her work.  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  She also cannot remember the various inventory.  Regarding other history.  While in labor with her son in 1999, she reportedly developed dangerously high blood pressure.  She gave birth and her blood pressure normalized but she said her left arm and leg were transiently numb and weak.  She never had further imaging.    PAST MEDICAL HISTORY: Past Medical History:  Diagnosis Date   Asthma    Hypertension    IBS  (irritable bowel syndrome)    PONV (postoperative nausea and vomiting)    Scope patch helps   Stroke [facility])     PAST SURGICAL HISTORY: Past Surgical History:  Procedure Laterality Date   CESAREAN SECTION  2002   w/ tubal ligation   CHOLECYSTECTOMY  1997   HERNIA REPAIR  2004   INGUINAL HERNIA REPAIR Bilateral 02/24/2022   Procedure: LAPAROSCOPIC ABDOMINAL LYSIS OF ADHESIONS, RIGHT TRIPLE NEURECTOMY, RIGHT FEMORAL L HERNIA REPAIR, LEFT INGUINAL HERNIA  REPAIR WITH MESH TAP BLOCK;  Surgeon: Karie Soda, MD;  Location: [facility] [place];  Service: General;  Laterality: Bilateral;   LAPAROSCOPY N/A 02/24/2022   Procedure: LAPAROSCOPY DIAGNOSTIC;  Surgeon: Karie Soda, MD;  Location: [facility];  Service: General;  Laterality: N/A;   TUBAL LIGATION      MEDICATIONS: Current Outpatient Medications on File Prior to Visit  Medication Sig Dispense Refill   acetaminophen (TYLENOL) 325 MG tablet Take 2 tablets every 6 hours by oral route.     albuterol (VENTOLIN HFA) 108 (90 Base) MCG/ACT inhaler Inhale into the lungs every 6 (six) hours as needed for wheezing or shortness of breath.     Cholecalciferol (VITAMIN D-1000 MAX ST) 25 MCG (1000 UT) tablet Take by mouth.     EC-NAPROXEN 500 MG EC tablet Take 500 mg by mouth 2 (two) times daily as needed.     gabapentin (NEURONTIN) 100 MG capsule Take 100 mg by mouth 3 (three) times daily.     hydrochlorothiazide (HYDRODIURIL) 25 MG tablet Take 25 mg by mouth daily.     valsartan (DIOVAN) 40 MG tablet Take 40 mg by mouth daily.     No current facility-administered medications on file prior to visit.    ALLERGIES: Allergies  Allergen Reactions   Lisinopril Swelling   Mobic [Meloxicam] Swelling    Leg swelling. OK to take ibuprofen/aleve   Norvasc [Amlodipine] Swelling   Tamiflu [Oseltamivir] Hives   Tape Dermatitis    Clear transpore ok   Tetracycline Other (See Comments)    Breakthrough bleeding>   Trazodone  Hives    FAMILY HISTORY: No family history on file.  Objective:  Blood pressure (!) 146/83, pulse 86, height 5\' 7"  (1.702 m), weight 250 lb 12.8 oz (113.8 kg), last menstrual period 04/08/2022, SpO2 98 %. General: No acute distress.  Patient appears well-groomed.   Head:  Normocephalic/atraumatic Eyes:  fundi examined but not visualized Neck: supple, no paraspinal tenderness, full range of motion Back: No paraspinal tenderness Heart: regular rate and rhythm Lungs: Clear to auscultation bilaterally. Vascular: No carotid bruits. Neurological Exam: Mental status: alert and oriented to person, place, and time, speech fluent and not dysarthric, language intact.    04/26/2022   12:00 PM  [employer] Mental Exam  Weekday Correct 1  Current year 1  What state are we in? 1  Amount spent 1  Amount left 2  # of Animals 3  5 objects recall 2  Number series 2  Hour markers 2  Time correct 0  Placed X in triangle correctly 1  Largest Figure 1  Name of female 2  Date back to work 2  Type of work 2  State she lived in 2  Total score 25   Cranial nerves: CN I: not tested CN II: pupils equal, round and reactive to light, visual fields intact CN III, IV, VI:  full range of motion, no nystagmus, no ptosis CN V: facial sensation intact. CN VII: upper and lower face symmetric CN VIII: hearing intact CN IX, X: gag intact, uvula midline CN XI: sternocleidomastoid and trapezius muscles intact CN XII: tongue midline Bulk & Tone: normal, no fasciculations. Motor:  muscle strength 5/5 throughout Sensation:  Pinprick, temperature and vibratory sensation intact. Deep Tendon Reflexes:  2+ throughout,  toes downgoing.   Finger to nose testing:  Without dysmetria.   Heel to shin:  Without dysmetria.   Gait:  Normal station and stride.  Romberg negative.    Thank you for allowing me to take part in the care of this patient.  Shon Millet, DO  CC:  Gilford Silvius, FNP  Ernie Avena,  FNP

## 2022-04-26 ENCOUNTER — Ambulatory Visit (INDEPENDENT_AMBULATORY_CARE_PROVIDER_SITE_OTHER): Payer: BC Managed Care – PPO | Admitting: Neurology

## 2022-04-26 ENCOUNTER — Encounter: Payer: Self-pay | Admitting: Neurology

## 2022-04-26 VITALS — BP 146/83 | HR 86 | Ht 67.0 in | Wt 250.8 lb

## 2022-04-26 DIAGNOSIS — R200 Anesthesia of skin: Secondary | ICD-10-CM | POA: Diagnosis not present

## 2022-04-26 DIAGNOSIS — R413 Other amnesia: Secondary | ICD-10-CM

## 2022-04-26 NOTE — Patient Instructions (Signed)
You do not have MS.  The MRI showed an old tiny stroke which is incidental - it would not explain your symptoms.  You may have gotten that while still in the womb.  For memory, we will get neuropsychological evaluation  For the left arm numbness, will schedule for nerve conduction study of left arm  Follow up after testing.  Further recommendations pending results.  `

## 2022-05-05 ENCOUNTER — Encounter: Payer: Self-pay | Admitting: Family Medicine

## 2022-05-08 ENCOUNTER — Ambulatory Visit: Payer: BC Managed Care – PPO | Admitting: Physical Therapy

## 2022-05-08 ENCOUNTER — Encounter: Payer: Self-pay | Admitting: Physical Therapy

## 2022-05-08 DIAGNOSIS — R4189 Other symptoms and signs involving cognitive functions and awareness: Secondary | ICD-10-CM | POA: Diagnosis not present

## 2022-05-08 DIAGNOSIS — M6281 Muscle weakness (generalized): Secondary | ICD-10-CM | POA: Diagnosis not present

## 2022-05-08 DIAGNOSIS — R202 Paresthesia of skin: Secondary | ICD-10-CM | POA: Diagnosis not present

## 2022-05-08 DIAGNOSIS — R413 Other amnesia: Secondary | ICD-10-CM | POA: Diagnosis not present

## 2022-05-08 DIAGNOSIS — R41840 Attention and concentration deficit: Secondary | ICD-10-CM | POA: Diagnosis not present

## 2022-05-08 DIAGNOSIS — M79604 Pain in right leg: Secondary | ICD-10-CM

## 2022-05-08 DIAGNOSIS — R278 Other lack of coordination: Secondary | ICD-10-CM | POA: Diagnosis not present

## 2022-05-08 DIAGNOSIS — R4701 Aphasia: Secondary | ICD-10-CM | POA: Diagnosis not present

## 2022-05-08 DIAGNOSIS — Z8673 Personal history of transient ischemic attack (TIA), and cerebral infarction without residual deficits: Secondary | ICD-10-CM | POA: Diagnosis not present

## 2022-05-08 NOTE — Therapy (Signed)
OUTPATIENT PHYSICAL THERAPY TREATMENT NOTE   Patient Name: Hannah Ray MRN: [id number] DOB:1975/10/11, 46 y.o., female Today's Date: 05/08/2022  PCP: Aldean Ast, PA-C REFERRING PROVIDER: Michael Boston, MD  END OF SESSION:   PT End of Session - 05/08/22 0935     Visit Number 4    Date for PT Re-Evaluation 06/19/22    Authorization Type [employer] approved 11 visits    Authorization Time Period 04/04/22 - 07/02/22    Authorization - Visit Number 3    Authorization - Number of Visits 11    PT Start Time 0930    PT Stop Time 1010    PT Time Calculation (min) 40 min    Activity Tolerance Patient tolerated treatment well    Behavior During Therapy WFL for tasks assessed/performed             Past Medical History:  Diagnosis Date   Asthma    Hypertension    IBS (irritable bowel syndrome)    Migraines    PONV (postoperative nausea and vomiting)    Scope patch helps   Stroke [facility])    Past Surgical History:  Procedure Laterality Date   CESAREAN SECTION  2002   w/ tubal ligation   CHOLECYSTECTOMY  1997   HERNIA REPAIR  2004   INGUINAL HERNIA REPAIR Bilateral 02/24/2022   Procedure: LAPAROSCOPIC ABDOMINAL LYSIS OF ADHESIONS, RIGHT TRIPLE NEURECTOMY, RIGHT FEMORAL L HERNIA REPAIR, LEFT INGUINAL HERNIA  REPAIR WITH MESH TAP BLOCK;  Surgeon: Michael Boston, MD;  Location: [place];  Service: General;  Laterality: Bilateral;   LAPAROSCOPY N/A 02/24/2022   Procedure: LAPAROSCOPY DIAGNOSTIC;  Surgeon: Michael Boston, MD;  Location: [place];  Service: General;  Laterality: N/A;   TUBAL LIGATION     Patient Active Problem List   Diagnosis Date Noted   Bulging of cervical intervertebral disc 04/10/2022   BMI 35.0-35.9,adult 12/28/2021   Inguinal pain, right 12/28/2021   Anxiety 08/18/2021   Asthma 08/18/2021   Essential (primary) hypertension 08/18/2021   Vitamin D deficiency 08/18/2021   REFERRING DIAG:  R10.31 (ICD-10-CM) - Inguinal  pain, right  K41.90 (ICD-10-CM) - Unilateral femoral hernia, without obstruction or gangrene, not specified as recurrent  K40.90 (ICD-10-CM) - Unilateral inguinal hernia, without obstruction or gangrene, not specified as recurrent  N39.41 (ICD-10-CM) - Urge incontinence  R20.0 (ICD-10-CM) - Anesthesia of skin      THERAPY DIAG:  Muscle weakness (generalized)   Other lack of coordination   Pain in right leg   Rationale for Evaluation and Treatment Rehabilitation   ONSET DATE: 02/24/2022   SUBJECTIVE:  SUBJECTIVE STATEMENT: The urologist gave me Mybetriq. Patient has not had any leakage and have been doing her exercises. Not wearing pads.  I can walk 2 miles and cross my right leg over the left.      PAIN:  Are you having pain? Yes NPRS scale: 4/10 Pain location:  right hip down to the lateral right knee.    Pain type: aching and tearing, tender Pain description: constant and the lateral right thigh is intermittent.      Aggravating factors: walking more than 15 minutes Relieving factors: heat   PRECAUTIONS: Other: recent stroke with short term memory loss; Bilateral hernia repair on 02/24/2022 ;    WEIGHT BEARING RESTRICTIONS No   FALLS:  Has patient fallen in last 6 months? No   LIVING ENVIRONMENT: Lives with: lives with their family   OCCUPATION: medical leave due to the stroke, has an office job   PLOF: Independent   PATIENT GOALS be able to cross her right leg over the left   PERTINENT HISTORY:  C-section; hernia repair 2004 and bilateral inguinal hernia 02/24/2022, IBS; recent stroke in the past month, Right triple neurectomy 02/24/2022     URINATION Pain with urination: No Fully empty bladder: Yes:   Stream: Strong Urgency: No Frequency: urinates every 1-2 hours; able to  start the urine but unable to stop it. No urge until she starts to leak and is new since the surgery.  Leakage:  she does not realize it.  Pads: Yes: 2-4 pads per day   I PREGNANCY Vaginal deliveries 2 Tearing Yes: small amount C-section deliveries 1 Currently pregnant No       OBJECTIVE:    DIAGNOSTIC FINDINGS:  none     COGNITION:            Overall cognitive status: Impaired      Trouble with short term memory and explaining things                     SENSATION:            Light touch: Deficits along the right lateral thigh            Proprioception: Appears intact     FUNCTIONAL TESTS:    GAIT: Distance walked: walked 20 feet Assistive device utilized: None Level of assistance: Complete Independence Comments: she will sometimes drag her right foot , decreased right hip flexion                POSTURE: No Significant postural limitations               PELVIC ALIGNMENT: in correct alignment   LUMBARAROM/PROM lumbar ROM is full   LOWER EXTREMITY ROM:   Active ROM Right eval Left eval Right 05/08/2022  Hip flexion Active 80; passive 110 110 110 active and passive  Hip internal rotation Passive 8 with end range pain   25  Hip external rotation Passive 40 with end range pain   65   (Blank rows = not tested)   LOWER EXTREMITY MMT:   MMT Right eval Left eval Right  05/08/2022  Hip extension 4+/5   5/5  Hip abduction 3/5   5/5  Hip adduction 4/5   5/5     PALPATION:   General  surgical site in the umbilicus is restricted, tenderness along the c-section scar and where the patches were placed. Tightness in right hip adductor; tenderness located in the right thoracic lumbar area where the  neurectomy was perfromed.                   Patient confirms identification and approves PT to assess internal pelvic floor and treatment No, deferred Patient reports she has placed her finger in her vaginal canal and felt a circular contraction            TODAY'S  TREATMENT  05/08/2022 Manual: Soft tissue mobilization:manual work to the umbilicus to release around the scar and improve mobility.  Neuromuscular re-education: Pelvic floor contraction training:sit to stand with pelvic floor contraction 5 times Standing hip flexion 10x each side with pelvic floor contraction Standing side walk with red band holding onto wall and squat Side lunge with red band around legs and contract the pelvic floor TODAY'S TREATMENT  04/25/22: Nustep L3 x 9' seat 10 arms 10, PT present to discuss symptoms Standing stretches: straddle stance, squeeze glutes, drift pelvis ant for hip flexor stretch; straddle stance gentle shallow side lunges for adductor mobility x 20 High knee march with toe tap to 3rd step x 10 Progress Rt hip flexor stretch to foot on 2nd step with Rt arm overhead/Lt trunk SB 3x15" (HEP) Seated ball squeeze with PF contraction x 3 breaths, 10 reps Sit to stand with PF contraction: able to perform 4 reps with prolonged lift before fatigue Standing Rt IT Band stretch with overhead reach in doorframe 3x15" (HEP) Supine Rt IT Band stretch Lt leg crossed over Rt 3x15" (HEP) Supine butterfly stretch x 30" bil Manual therapy: Rt proximal adductor STM, proximal ITB/TFL STM     04/04/22: Suprapubic fascial stretching and instruction for self-stretching 3-ways STM Rt ITB and lateral quad, adductors proximally Supine single knee to chest from leg extended with knee hug x 10, 5 sec hold Supine stretches: bil butterfly, Rt fig 4, Rt piriformis Standing stretches: straddle stance, squeeze glutes, drift pelvis ant for hip flexor stretch; straddle stance gentle shallow side lunges for adductor mobility x 20 Counter march alt x 20, hip ext Rt 2x10 HEP instructions including standing stretches, supine stretches and self-stretch to suprapubic fascia       PATIENT EDUCATION:  05/08/2022 Education details: Access Code: [email or web address] Person educated: Patient and  Spouse Education method: Explanation, Demonstration, Tactile cues, Verbal cues, and Handouts Education comprehension: verbalized understanding, returned demonstration, verbal cues required, tactile cues required, and needs further education     HOME EXERCISE PROGRAM: 05/08/2022 Access Code: [email or web address] URL: [email or web address] Date: 05/08/2022 Prepared by: Earlie Counts  Exercises - Seated Pelvic Floor Contraction  - 3 x daily - 7 x weekly - 1 sets - 10 reps - 5 sec hold - Seated Quick Flick Pelvic Floor Contractions  - 3 x daily - 7 x weekly - 1 sets - 5 reps - Supine Single Knee to Chest  - 2 x daily - 7 x weekly - 1 sets - 3 reps - 15 hold - Supine Butterfly Groin Stretch  - 2 x daily - 7 x weekly - 1 sets - 3 reps - 15 hold - Supine Figure 4 Piriformis Stretch  - 2 x daily - 7 x weekly - 1 sets - 3 reps - 15 hold - Supine Piriformis Stretch with Foot on Ground  - 2 x daily - 7 x weekly - 1 sets - 3 reps - 15 hold - Standing March with Counter Support  - 2 x daily - 7 x weekly - 1 sets - 20 reps - Standing Hip Extension with Counter Support  -  2 x daily - 7 x weekly - 2 sets - 10 reps - Hip Flexor Stretch on Step  - 3 x daily - 7 x weekly - 1 sets - 3 reps - 10-15 hold - Supine ITB Stretch  - 3 x daily - 7 x weekly - 1 sets - 3 reps - 10-15 hold - Standing ITB Stretch  - 3 x daily - 7 x weekly - 1 sets - 3 reps - 10-15 hold - Sit to Stand with Pelvic Floor Contraction  - 1 x daily - 3 x weekly - 1 sets - 10 reps - Standing Marching  - 1 x daily - 3 x weekly - 2 sets - 10 reps - Side Stepping with Resistance at Thighs  - 1 x daily - 3 x weekly - 3 sets - 10 reps - Upright Side Lunge  - 1 x daily - 3 x weekly - 2 sets - 10 reps   ASSESSMENT:   CLINICAL IMPRESSION: Patient is a 46 y.o. female who was seen today for physical therapy treatment for right inguinal pain and urge incontinence. Patient has full strength of bilateral hips. She is not leaking urine. She is able to go  on a car ride without the urgency. Patient is not wearing pads. She has increased in right hip ROM and is able to cross her right leg over the left. Patient is walking 2 miles now. Patient is independent with her HEP. She has met all of her goals.      OBJECTIVE IMPAIRMENTS decreased activity tolerance, decreased coordination, decreased endurance, difficulty walking, decreased ROM, decreased strength, increased fascial restrictions, increased muscle spasms, impaired sensation, and pain.    ACTIVITY LIMITATIONS continence, dressing, and locomotion level   PARTICIPATION LIMITATIONS: driving, shopping, and community activity   PERSONAL FACTORS C-section; hernia repair 2004 and bilateral inguinal hernia 02/24/2022, IBS; recent stroke in the past month, Right triple neurectomy 02/24/2022 are also affecting patient's functional outcome.    REHAB POTENTIAL: Good   CLINICAL DECISION MAKING: Evolving/moderate complexity   EVALUATION COMPLEXITY: Moderate     GOALS: Goals reviewed with patient? Yes   SHORT TERM GOALS: Target date: 04/24/2022   Patient is independent with initial HEP for hip stretches.  Baseline: Goal status: met 05/08/2022   2.  Patient is independent with pelvic floor exercises with contraction with exercise and holding for 5 sec.  Baseline:  Goal status: Met 05/08/2022     LONG TERM GOALS: Target date: 06/19/2022    Patient is independent with advanced HEP for hip and pelvic floor strength.  Baseline:  Goal status: Met 05/08/2022   2.  Patient is able to cross her right leg over the left so she can put her shoes on due to increased in right hip external rotation >/= 60 degrees  Baseline:  Goal status: Met 05/08/2022   3.  Patient has increased in right hip flexion actively >/= 105 degrees so she is able to lift her right leg to cross her leg over the left.  Baseline:  Goal status: Met 05/08/2022   4.  Patient report she is able to feel the urge to urinate so her urinary  leakage improved >/= 50%.  Baseline:  Goal status: Met 05/08/2022       PLAN: PT FREQUENCY: 1-2x/week   PT DURATION: 12 weeks   PLANNED INTERVENTIONS: Therapeutic exercises, Therapeutic activity, Neuromuscular re-education, Balance training, Gait training, Patient/Family education, Self Care, Joint mobilization, Dry Needling, Electrical stimulation, Cryotherapy, Moist heat,  scar mobilization, Biofeedback, and Manual therapy   PLAN FOR NEXT SESSION: Discharge to HEP Earlie Counts, PT 05/08/22 10:11 AM  PHYSICAL THERAPY DISCHARGE SUMMARY  Visits from Start of Care: 4  Current functional level related to goals / functional outcomes: See above.   Remaining deficits: See above.    Education / Equipment: HEP   Patient agrees to discharge. Patient goals were met. Patient is being discharged due to meeting the stated rehab goals.Thank you for the referral. Earlie Counts, PT 05/08/22 10:12 AM

## 2022-05-09 ENCOUNTER — Ambulatory Visit (INDEPENDENT_AMBULATORY_CARE_PROVIDER_SITE_OTHER): Payer: BC Managed Care – PPO | Admitting: Family Medicine

## 2022-05-09 ENCOUNTER — Encounter: Payer: Self-pay | Admitting: Neurology

## 2022-05-09 ENCOUNTER — Encounter: Payer: Self-pay | Admitting: Family Medicine

## 2022-05-09 VITALS — BP 117/74 | HR 73 | Temp 98.9°F | Ht 67.0 in | Wt 250.0 lb

## 2022-05-09 DIAGNOSIS — R4189 Other symptoms and signs involving cognitive functions and awareness: Secondary | ICD-10-CM | POA: Diagnosis not present

## 2022-05-09 DIAGNOSIS — I10 Essential (primary) hypertension: Secondary | ICD-10-CM

## 2022-05-09 DIAGNOSIS — R413 Other amnesia: Secondary | ICD-10-CM

## 2022-05-09 DIAGNOSIS — R6889 Other general symptoms and signs: Secondary | ICD-10-CM | POA: Diagnosis not present

## 2022-05-09 DIAGNOSIS — Z8673 Personal history of transient ischemic attack (TIA), and cerebral infarction without residual deficits: Secondary | ICD-10-CM | POA: Diagnosis not present

## 2022-05-09 MED ORDER — VALSARTAN 40 MG PO TABS: 40.0000 mg | ORAL_TABLET | Freq: Every day | ORAL | 2 refills | Status: AC

## 2022-05-09 NOTE — Progress Notes (Signed)
Subjective:  Patient ID: MYCA PERNO, female    DOB: [birth year], 46 y.o.   MRN: [id number]  Patient Care Team: Baruch Gouty, FNP as PCP - General (Family Medicine) Michael Boston, MD as Consulting Physician (General Surgery) Nelwyn Salisbury, [place] as Physician Assistant (Obstetrics and Gynecology) Amie Portland, MD as Consulting Physician (Neurology)   Chief Complaint:  long term disability   HPI: Hannah Ray is a 46 y.o. female presenting on 05/09/2022 for long term disability   Pt presents today to discuss disability paperwork completed. She was recently evaluated by Dr. Tomi Likens and has upcoming diagnostic procedures scheduled and appointment with neuropsychology scheduled. She continues to have memory deficits, brain fog, lack of concentration, and word finding difficulties. She has not been able to return to work due to ongoing symptoms. She has seen urology and was started on Myrbetriq which has been very beneficial.      Relevant past medical, surgical, family, and social history reviewed and updated as indicated.  Allergies and medications reviewed and updated. Data reviewed: Chart in Epic.   Past Medical History:  Diagnosis Date   Asthma    Hypertension    IBS (irritable bowel syndrome)    Migraines    PONV (postoperative nausea and vomiting)    Scope patch helps   Stroke [facility])     Past Surgical History:  Procedure Laterality Date   CESAREAN SECTION  2002   w/ tubal ligation   CHOLECYSTECTOMY  1997   HERNIA REPAIR  2004   INGUINAL HERNIA REPAIR Bilateral 02/24/2022   Procedure: LAPAROSCOPIC ABDOMINAL LYSIS OF ADHESIONS, RIGHT TRIPLE NEURECTOMY, RIGHT FEMORAL L HERNIA REPAIR, LEFT INGUINAL HERNIA  REPAIR WITH MESH TAP BLOCK;  Surgeon: Michael Boston, MD;  Location: [place];  Service: General;  Laterality: Bilateral;   LAPAROSCOPY N/A 02/24/2022   Procedure: LAPAROSCOPY DIAGNOSTIC;  Surgeon: Michael Boston, MD;  Location: [place];  Service: General;  Laterality: N/A;   TUBAL LIGATION      Social History   Socioeconomic History   Marital status: Married    Spouse name: Not on file   Number of children: Not on file   Years of education: Not on file   Highest education level: Not on file  Occupational History   Not on file  Tobacco Use   Smoking status: Never   Smokeless tobacco: Never  Vaping Use   Vaping Use: Every day   Substances: Nicotine  Substance and Sexual Activity   Alcohol use: Never   Drug use: Never   Sexual activity: Yes  Other Topics Concern   Not on file  Social History Narrative   Not on file   Social Determinants of Health   Financial Resource Strain: Not on file  Food Insecurity: Not on file  Transportation Needs: Not on file  Physical Activity: Not on file  Stress: Not on file  Social Connections: Not on file  Intimate Partner Violence: Not on file    Outpatient Encounter Medications as of 05/09/2022  Medication Sig   acetaminophen (TYLENOL) 325 MG tablet Take 2 tablets every 6 hours by oral route.   albuterol (VENTOLIN HFA) 108 (90 Base) MCG/ACT inhaler Inhale into the lungs every 6 (six) hours as needed for wheezing or shortness of breath.   Cholecalciferol (VITAMIN D-1000 MAX ST) 25 MCG (1000 UT) tablet Take by mouth.   gabapentin (NEURONTIN) 100 MG capsule Take 100 mg by mouth 3 (three) times daily.  hydrochlorothiazide (HYDRODIURIL) 25 MG tablet Take 25 mg by mouth daily.   [DISCONTINUED] valsartan (DIOVAN) 40 MG tablet Take 40 mg by mouth daily.   valsartan (DIOVAN) 40 MG tablet Take 1 tablet (40 mg total) by mouth daily.   [DISCONTINUED] EC-NAPROXEN 500 MG EC tablet Take 500 mg by mouth 2 (two) times daily as needed.   No facility-administered encounter medications on file as of 05/09/2022.    Allergies  Allergen Reactions   Lisinopril Swelling   Mobic [Meloxicam] Swelling    Leg swelling. OK to take ibuprofen/aleve   Norvasc [Amlodipine] Swelling    Tamiflu [Oseltamivir] Hives   Tape Dermatitis    Clear transpore ok   Tetracycline Other (See Comments)    Breakthrough bleeding>   Trazodone Hives    Review of Systems  Constitutional:  Negative for activity change, appetite change, chills, diaphoresis, fatigue, fever and unexpected weight change.  HENT: Negative.    Eyes: Negative.  Negative for photophobia and visual disturbance.  Respiratory:  Negative for cough, chest tightness and shortness of breath.   Cardiovascular:  Negative for chest pain, palpitations and leg swelling.  Gastrointestinal:  Negative for abdominal pain, blood in stool, constipation, diarrhea, nausea and vomiting.  Endocrine: Negative.   Genitourinary:  Negative for decreased urine volume, difficulty urinating, dysuria, frequency and urgency.  Musculoskeletal:  Negative for arthralgias and myalgias.  Skin: Negative.   Allergic/Immunologic: Negative.   Neurological:  Negative for dizziness, tremors, seizures, syncope, facial asymmetry, speech difficulty, weakness, light-headedness, numbness and headaches.  Hematological: Negative.   Psychiatric/Behavioral:  Positive for confusion and decreased concentration. Negative for agitation, behavioral problems, dysphoric mood, hallucinations, self-injury, sleep disturbance and suicidal ideas. The patient is not nervous/anxious and is not hyperactive.   All other systems reviewed and are negative.       Objective:  BP 117/74   Pulse 73   Temp 98.9 F (37.2 C)   Ht _0  (1.702 m)   Wt 250 lb (113.4 kg)   LMP 04/19/2022 (Exact Date) Comment: started  SpO2 97%   BMI 39.16 kg/m    Wt Readings from Last 3 Encounters:  05/09/22 250 lb (113.4 kg)  04/26/22 250 lb 12.8 oz (113.8 kg)  04/11/22 252 lb (114.3 kg)    Physical Exam Vitals and nursing note reviewed.  Constitutional:      General: She is not in acute distress.    Appearance: Normal appearance. She is obese. She is not ill-appearing, toxic-appearing  or diaphoretic.  HENT:     Mouth/Throat:     Mouth: Mucous membranes are moist.  Eyes:     Pupils: Pupils are equal, round, and reactive to light.  Cardiovascular:     Rate and Rhythm: Normal rate and regular rhythm.     Heart sounds: Normal heart sounds.  Pulmonary:     Effort: Pulmonary effort is normal.     Breath sounds: Normal breath sounds.  Musculoskeletal:     Right lower leg: No edema.     Left lower leg: No edema.  Skin:    General: Skin is warm and dry.     Capillary Refill: Capillary refill takes less than 2 seconds.  Neurological:     General: No focal deficit present.     Mental Status: She is alert and oriented to person, place, and time.  Psychiatric:        Mood and Affect: Mood normal.        Behavior: Behavior normal.  Thought Content: Thought content normal.        Judgment: Judgment normal.     Results for orders placed or performed in visit on 04/11/22  CMP14+EGFR  Result Value Ref Range   Glucose 94 70 - 99 mg/dL   BUN 12 6 - 24 mg/dL   Creatinine, Ser 0.61 0.57 - 1.00 mg/dL   eGFR 112 >59 mL/min/1.73   BUN/Creatinine Ratio 20 9 - 23   Sodium 140 134 - 144 mmol/L   Potassium 3.7 3.5 - 5.2 mmol/L   Chloride 100 96 - 106 mmol/L   CO2 25 20 - 29 mmol/L   Calcium 9.6 8.7 - 10.2 mg/dL   Total Protein 6.6 6.0 - 8.5 g/dL   Albumin 4.4 3.9 - 4.9 g/dL   Globulin, Total 2.2 1.5 - 4.5 g/dL   Albumin/Globulin Ratio 2.0 1.2 - 2.2   Bilirubin Total 0.4 0.0 - 1.2 mg/dL   Alkaline Phosphatase 52 44 - 121 IU/L   AST 19 0 - 40 IU/L   ALT 20 0 - 32 IU/L  CBC with Differential/Platelet  Result Value Ref Range   WBC 6.1 3.4 - 10.8 x10E3/uL   RBC 4.56 3.77 - 5.28 x10E6/uL   Hemoglobin 13.3 11.1 - 15.9 g/dL   Hematocrit 39.6 34.0 - 46.6 %   MCV 87 79 - 97 fL   MCH 29.2 26.6 - 33.0 pg   MCHC 33.6 31.5 - 35.7 g/dL   RDW 13.8 11.7 - 15.4 %   Platelets 224 150 - 450 x10E3/uL   Neutrophils 56 Not Estab. %   Lymphs 29 Not Estab. %   Monocytes 8 Not  Estab. %   Eos 6 Not Estab. %   Basos 1 Not Estab. %   Neutrophils Absolute 3.4 1.4 - 7.0 x10E3/uL   Lymphocytes Absolute 1.8 0.7 - 3.1 x10E3/uL   Monocytes Absolute 0.5 0.1 - 0.9 x10E3/uL   EOS (ABSOLUTE) 0.4 0.0 - 0.4 x10E3/uL   Basophils Absolute 0.1 0.0 - 0.2 x10E3/uL   Immature Granulocytes 0 Not Estab. %   Immature Grans (Abs) 0.0 0.0 - 0.1 x10E3/uL  Bayer DCA Hb A1c Waived  Result Value Ref Range   HB A1C (BAYER DCA - WAIVED) 5.4 4.8 - 5.6 %  Thyroid Panel With TSH  Result Value Ref Range   TSH 1.440 0.450 - 4.500 uIU/mL   T4, Total 8.1 4.5 - 12.0 ug/dL   T3 Uptake Ratio 24 24 - 39 %   Free Thyroxine Index 1.9 1.2 - 4.9  Vitamin B12  Result Value Ref Range   Vitamin B-12 523 232 - 1,245 pg/mL  VITAMIN D 25 Hydroxy (Vit-D Deficiency, Fractures)  Result Value Ref Range   Vit D, 25-Hydroxy 40.9 30.0 - 100.0 ng/mL       Pertinent labs & imaging results that were available during my care of the patient were reviewed by me and considered in my medical decision making.  Assessment & Plan:  Hannah Ray was seen today for long term disability.  Diagnoses and all orders for this visit:  Memory loss Brain fog History of stroke Pt presents today to have disability paperwork completed. She has several pending appointment with neurology and neuropsychology. Previous FMLA paperwork from Dr. Johney Maine' office obtained and reviewed today.  -     Hormone Panel  Essential (primary) hypertension BP well controlled. Changes were not made in regimen today. Goal BP is 130/80. Pt aware to report any persistent high or low readings. DASH diet and  exercise encouraged. Exercise at least 150 minutes per week and increase as tolerated. Goal BMI > 25. Stress management encouraged. Avoid nicotine and tobacco product use. Avoid excessive alcohol and NSAID's. Avoid more than 2000 mg of sodium daily. Medications as prescribed. Follow up as scheduled.  -     valsartan (DIOVAN) 40 MG tablet; Take 1 tablet (40  mg total) by mouth daily.     Continue all other maintenance medications.  Follow up plan: Return in about 4 months (around 09/08/2022), or if symptoms worsen or fail to improve.   Continue healthy lifestyle choices, including diet (rich in fruits, vegetables, and lean proteins, and low in salt and simple carbohydrates) and exercise (at least 30 minutes of moderate physical activity daily).   The above assessment and management plan was discussed with the patient. The patient verbalized understanding of and has agreed to the management plan. Patient is aware to call the clinic if they develop any new symptoms or if symptoms persist or worsen. Patient is aware when to return to the clinic for a follow-up visit. Patient educated on when it is appropriate to go to the emergency department.   Monia Pouch, FNP-C [facility] [phone]

## 2022-05-10 ENCOUNTER — Ambulatory Visit: Payer: BC Managed Care – PPO | Admitting: Occupational Therapy

## 2022-05-10 ENCOUNTER — Ambulatory Visit: Payer: BC Managed Care – PPO | Admitting: Physical Therapy

## 2022-05-10 ENCOUNTER — Encounter: Payer: Self-pay | Admitting: Family Medicine

## 2022-05-10 DIAGNOSIS — R413 Other amnesia: Secondary | ICD-10-CM | POA: Diagnosis not present

## 2022-05-10 DIAGNOSIS — Z8673 Personal history of transient ischemic attack (TIA), and cerebral infarction without residual deficits: Secondary | ICD-10-CM | POA: Diagnosis not present

## 2022-05-10 DIAGNOSIS — R41840 Attention and concentration deficit: Secondary | ICD-10-CM

## 2022-05-10 DIAGNOSIS — M6281 Muscle weakness (generalized): Secondary | ICD-10-CM | POA: Diagnosis not present

## 2022-05-10 DIAGNOSIS — R278 Other lack of coordination: Secondary | ICD-10-CM | POA: Diagnosis not present

## 2022-05-10 DIAGNOSIS — R4701 Aphasia: Secondary | ICD-10-CM | POA: Diagnosis not present

## 2022-05-10 DIAGNOSIS — R4189 Other symptoms and signs involving cognitive functions and awareness: Secondary | ICD-10-CM | POA: Diagnosis not present

## 2022-05-10 DIAGNOSIS — M79604 Pain in right leg: Secondary | ICD-10-CM | POA: Diagnosis not present

## 2022-05-10 DIAGNOSIS — R202 Paresthesia of skin: Secondary | ICD-10-CM | POA: Diagnosis not present

## 2022-05-10 NOTE — Therapy (Addendum)
OUTPATIENT OCCUPATIONAL THERAPY TREATMENT NOTE  Patient Name: Hannah Ray MRN: [id number] DOB:[birth year], 46 y.o., female Today's Date: 05/10/2022  PCP: Baruch Gouty, FNP REFERRING PROVIDER: Baruch Gouty, FNP   OT End of Session - 05/10/22  [id number]   Visit Number 1   Number of Visits 7   Date for OT Re-Evaluation 07/07/2022   Authorization Type [employer]   Authorization Time Period VL: 30 (combined OT/PT)   OT Start Time 0840   OT Stop Time 0920   OT Time Calculation (min) 40 min   Behavior During Therapy WFL for tasks assessed/performed   Past Medical History:  Diagnosis Date   Asthma    Hypertension    IBS (irritable bowel syndrome)    Migraines    PONV (postoperative nausea and vomiting)    Scope patch helps   Stroke [facility])    Past Surgical History:  Procedure Laterality Date   CESAREAN SECTION  2002   w/ tubal ligation   CHOLECYSTECTOMY  1997   HERNIA REPAIR  2004   INGUINAL HERNIA REPAIR Bilateral 02/24/2022   Procedure: LAPAROSCOPIC ABDOMINAL LYSIS OF ADHESIONS, RIGHT TRIPLE NEURECTOMY, RIGHT FEMORAL L HERNIA REPAIR, LEFT INGUINAL HERNIA  REPAIR WITH MESH TAP BLOCK;  Surgeon: Michael Boston, MD;  Location: [place];  Service: General;  Laterality: Bilateral;   LAPAROSCOPY N/A 02/24/2022   Procedure: LAPAROSCOPY DIAGNOSTIC;  Surgeon: Michael Boston, MD;  Location: [place];  Service: General;  Laterality: N/A;   TUBAL LIGATION     Patient Active Problem List   Diagnosis Date Noted   Bulging of cervical intervertebral disc 04/10/2022   Memory loss 02/24/2022   BMI 35.0-35.9,adult 12/28/2021   Inguinal pain, right 12/28/2021   Anxiety 08/18/2021   Asthma 08/18/2021   Essential (primary) hypertension 08/18/2021   Vitamin D deficiency 08/18/2021    ONSET DATE: 04/11/2022  REFERRING DIAG: Q22.29 (ICD-10-CM) - History of stroke R20.2 (ICD-10-CM) - Paresthesia R41.89 (ICD-10-CM) - Brain fog R47.01 (ICD-10-CM) - Expressive  aphasia R41.3 (ICD-10-CM) - Memory loss   THERAPY DIAG:  Muscle weakness (generalized)  Other lack of coordination  Attention and concentration deficit  Rationale for Evaluation and Treatment Rehabilitation  SUBJECTIVE:   SUBJECTIVE STATEMENT: Pt arrives to OP OT reporting L-sided weakness, trouble with her memory, and difficulty concentrating that started after a procedure back in July of this year. States her concerns at this point are mostly with her memory, word finding, and brain fog. Pt accompanied by: self and significant other  PERTINENT HISTORY: L-sided weakness; decreased coordination; brain fog and difficulty concentrating Per [place] office visit w/ Darla Lesches, FNP on 04/11/22: "...ongoing neurological symptoms post surgery 02/24/2022. Pt has a complicated medical history. She had an open hernia repair in 2007 which caused ongoing RLQ and right groin pain. She was eventually referred to Dr. Johney Maine for exploratory laparoscopic surgery which she had on 02/24/2022. During procedure she had right femoral hernia repair, left inguinal hernia repair, lysis of adhesions, and right triple neurectomy. States after this surgery she developed left sided weakness, memory loss, aphasia, brain fog, and headaches when concentrating. She went to the ED on 03/22/2022. 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 She was referred to neurosurgery and neurology. Has appointments scheduled in November. She continues to have issues with speech and putting thoughts together, left sided weakness, coordination issues, and memory loss."  PRECAUTIONS: None  WEIGHT BEARING  RESTRICTIONS: No  PAIN: Are you having pain? No  FALLS: Has patient fallen in last 6 months? No  LIVING ENVIRONMENT: Lives with: lives with their family Lives in: House/apartment Stairs: No; has stairs to enter Has following  equipment at home: None  PLOF: Independent, Vocation/Vocational requirements: currently on short-term disability, applying for long-term; was full-time consumer support (computer work, some heavy lifting), and Leisure: Probation officer, Pharmacist, community  PATIENT GOALS: "Improve"   OBJECTIVE:   HAND DOMINANCE: Right  ADLs: Overall ADLs: Independent w/ BADLs; able to complete clothing manipulatives w/ extended time  IADLs: Meal Prep: Difficulty w/ bilateral tasks; memory/safety Medication management: Mod Ind; uses alerts  MOBILITY STATUS: Independent  ACTIVITY TOLERANCE: Decreased generalized endurance, per pt report  UPPER EXTREMITY ROM    BUE WFL  UPPER EXTREMITY MMT:  HAND FUNCTION: Grip strength: Right: 63 lbs; Left: 24 lbs  COORDINATION: 9 Hole Peg test: Right: 24.88 sec; Left: 34.30 sec  SENSATION: Reports numbness along ulnar nerve distribution of LUE  COGNITION: Overall cognitive status: Within functional limits for tasks assessed Pt reports concerns w/ memory, both short-term memory and working Insurance account manager Exam: 26/30 Difficulty w/ delayed recall w/ interference and immediate recall  TODAY'S TREATMENT:  None - evaluation only   PATIENT EDUCATION: Educated on role and purpose of OT as well as potential interventions and goals for therapy based on initial evaluation findings.  Person educated: Patient and Spouse Education method: Explanation Education comprehension: verbalized understanding   HOME EXERCISE PROGRAM: To be administered   GOALS: Goals reviewed with patient? No  SHORT TERM GOALS: Target date: 06/07/22    Status:  1 Pt will verbalize understanding of at least 2 memory compensatory strategies and be able to identify pt-specific examples of options to incorporate strategies into daily routines  Initial  2 Pt will demonstrate independence w/ HEP designed for coordination, sensation, and LUE/hand strengthening  Initial    LONG TERM GOALS:  Target date: 07/07/22    Status:  1 Pt will improve efficiency during functional fine motor tasks (e.g., buttons, ties/laces) by decreasing time to complete 9-HPT w/ L hand by at least 3 sec  Baseline: Right: 24.88 sec; Left: 34.30 sec Initial  2 Pt will increase grip strength in L hand to at least 34 lbs for improved bilateral use at home and during higher level IADLs  Baseline: Right: 63 lbs; Left: 24 lbs Initial  3 Pt will demonstrate simulated meal prep, or report being able to complete meal prep activity at home w/ at least Mod Ind, incorporating compensatory strategies and AE as needed, by discharge  Initial    ASSESSMENT:  CLINICAL IMPRESSION: Pt is a 46 y/o who presents to OP OT due to left sided weakness, coordination issues, and memory loss s/p surgery in July 2023. Pt currently lives with her family in a single level home and is working on applying for disability; was working full-time prior to onset of symptoms. Pt will benefit from skilled occupational therapy services to address strength and coordination, ROM, pain management, altered sensation, balance, GM/FM control, cognition, visual perception, safety awareness, introduction of compensatory strategies/AE prn, and implementation of an HEP to improve participation and safety during ADLs and improve independence.   PERFORMANCE DEFICITS in functional skills including coordination, strength, body mechanics, endurance, and UE functional use, cognitive skills including memory and problem solving.   IMPAIRMENTS are limiting patient from ADLs, IADLs, work, leisure, and social participation.   COMORBIDITIES may have co-morbidities  that affects occupational performance. Patient will  benefit from skilled OT to address above impairments and improve overall function.  MODIFICATION OR ASSISTANCE TO COMPLETE EVALUATION: No modification of tasks or assist necessary to complete an evaluation.  OT OCCUPATIONAL PROFILE AND HISTORY: Detailed  assessment: Review of records and additional review of physical, cognitive, psychosocial history related to current functional performance.  CLINICAL DECISION MAKING: Moderate - several treatment options, min-mod task modification necessary  REHAB POTENTIAL: Good  EVALUATION COMPLEXITY: Moderate   PLAN: OT FREQUENCY: 1x/week  OT DURATION: 8 weeks  PLANNED INTERVENTIONS: self care/ADL training, therapeutic exercise, therapeutic activity, neuromuscular re-education, manual therapy, functional mobility training, electrical stimulation, ultrasound, moist heat, cryotherapy, patient/family education, cognitive remediation/compensation, energy conservation, and DME and/or AE instructions  RECOMMENDED OTHER SERVICES: SLP  CONSULTED AND AGREED WITH PLAN OF CARE: Patient  PLAN FOR NEXT SESSION: Memory compensatory strategies; sensory reeducation, try nerve gliding?; introduce HEP (coordination, putty for strengthening)   Rosie Fate, MSOT, OTR/L 05/10/2022, 9:24 AM

## 2022-05-15 ENCOUNTER — Encounter: Payer: BC Managed Care – PPO | Admitting: Psychology

## 2022-05-17 ENCOUNTER — Encounter: Payer: Self-pay | Admitting: Family Medicine

## 2022-05-17 ENCOUNTER — Encounter: Payer: BC Managed Care – PPO | Admitting: Physical Therapy

## 2022-05-18 ENCOUNTER — Ambulatory Visit (INDEPENDENT_AMBULATORY_CARE_PROVIDER_SITE_OTHER): Payer: BC Managed Care – PPO | Admitting: Neurology

## 2022-05-18 DIAGNOSIS — R200 Anesthesia of skin: Secondary | ICD-10-CM | POA: Diagnosis not present

## 2022-05-18 NOTE — Procedures (Signed)
  [facility]  [address], [address]  [place], [place] [address] Tel: [phone] Fax: [phone] Test Date:  05/18/2022  Patient: Hannah Ray DOB: 22-May-1976 Physician: Narda Amber, DO  Sex: Female Height: 5\' 7"  Ref Phys: Metta Clines, DO  ID#: [id number]   Technician:    History: This is a 46 year old female referred for evaluation of left arm numbness.  NCV & EMG Findings: Extensive electrodiagnostic testing of the left upper extremity shows:  Left median, ulnar, radial, and mixed palmer sensory responses are within normal limits.  Left median and ulnar motor responses are within normal limits. There is no evidence of active or chronic motor axonal loss changes affecting any of the tested muscles.  Motor unit configuration and recruitment pattern is within normal limits.  Impression: This is a normal study of the left upper extremity.  In particular, there is no evidence of carpal tunnel syndrome, ulnar neuropathy, or a cervical radiculopathy.   ___________________________ Narda Amber, DO    Nerve Conduction Studies Motor Nerve Results    Latency Amplitude F-Lat Segment Distance CV Comment  Site (ms) Norm (mV) Norm (ms)  (cm) (m/s) Norm   Left Median (APB) Motor  Wrist 3.1  < 3.9 7.4  > 6.0        Elbow 8.4 - 7.1 -  Elbow-Wrist 28 53  > 50   Left Ulnar (ADM) Motor  Wrist 2.7  < 3.1 10.7  > 7.0        Bel elbow 6.9 - 10.4 -  Bel elbow-Wrist 22 52  > 50   Ab elbow 8.7 - 10.3 -  Ab elbow-Bel elbow 10 56 -    Sensory Sites    Neg Peak Lat Amplitude (O-P) Segment Distance Velocity Comment  Site (ms) Norm (V) Norm  (cm) (ms)   Left Median Sensory  Wrist-Dig II 2.8  < 3.4 42  > 20 Wrist-Dig II 13    Left Median-Ulnar Palmar Sensory       Median  Palm-Wrist 1.63  < 2.2 77  > 10 Palm-Wrist 8         Ulnar  Palm-Wrist 1.45  < 2.2 18  > 5 Palm-Wrist 8    Left Radial Sensory  Forearm-Wrist 2.0  < 2.7 23  > 18 Forearm-Wrist 10    Left Ulnar Sensory   Wrist-Dig V 2.5  < 3.1 12  > 12 Wrist-Dig V 11     Inter-Nerve Comparisons   Nerve 1 Value 1 Nerve 2 Value 2 Parameter Result Normal  Sensory Sites  L Median Palm-Wrist 1.63 ms L Ulnar Palm-Wrist 1.45 ms Peak Lat Diff 0.18 ms <0.30   Electromyography   Side Muscle Nerve Root Ins.Act Fibs Fasc Recrt Amp Dur Poly Activation Comment  Left FDI Radial,  Ulnar C8-T1 Nml Nml Nml Nml Nml Nml Nml Nml N/A  Left Pronator teres Median C6-C7 Nml Nml Nml Nml Nml Nml Nml Nml N/A  Left Biceps Musculocut C5-C6 Nml Nml Nml Nml Nml Nml Nml Nml N/A  Left Triceps Radial C6-C8 Nml Nml Nml Nml Nml Nml Nml Nml N/A  Left Deltoid Axillary C5-C6 Nml Nml Nml Nml Nml Nml Nml Nml N/A      Waveforms:  Motor      Sensory

## 2022-05-19 ENCOUNTER — Ambulatory Visit: Payer: BC Managed Care – PPO | Admitting: Psychology

## 2022-05-19 ENCOUNTER — Ambulatory Visit (INDEPENDENT_AMBULATORY_CARE_PROVIDER_SITE_OTHER): Payer: BC Managed Care – PPO | Admitting: Psychology

## 2022-05-19 ENCOUNTER — Encounter: Payer: Self-pay | Admitting: Psychology

## 2022-05-19 DIAGNOSIS — R4189 Other symptoms and signs involving cognitive functions and awareness: Secondary | ICD-10-CM | POA: Diagnosis not present

## 2022-05-19 DIAGNOSIS — F449 Dissociative and conversion disorder, unspecified: Secondary | ICD-10-CM | POA: Diagnosis not present

## 2022-05-19 DIAGNOSIS — I6381 Other cerebral infarction due to occlusion or stenosis of small artery: Secondary | ICD-10-CM

## 2022-05-19 NOTE — Progress Notes (Addendum)
NEUROPSYCHOLOGICAL EVALUATION [place]. [place] [facility] of Neurology  Date of Evaluation: May 19, 2022  Reason for Referral:   Hannah Ray is a 46 y.o. right-handed Caucasian female referred by Metta Clines, D.O., to characterize her current cognitive functioning and assist with diagnostic clarity and treatment planning in the context of subjective cognitive decline.   Assessment and Plan:   Clinical Impression(s): Hannah Ray pattern of performance is suggestive of isolated variability primarily surrounding processing speed and delayed retrieval aspects of memory. Additional variability was noted across visuospatial abilities and encoding (i.e., learning) aspects of memory; however, these performances were largely appropriate relative to age-matched peers. No cognitive domains exhibited consistent impairment. Performances were appropriate across attention/concentration, executive functioning, and receptive and expressive language. While benefiting from compensatory strategies in her day-to-day life, she is able to perform activities of daily living independently. Given this and no consistent impairment across cognitive domains, I do not feel that current performances fully arise where a diagnosis of a mild neurocognitive disorder ("mild cognitive impairment") is currently warranted.   Presently, it is my opinion that current performances do not strongly suggest an underlying neurological contribution to subjective day-to-day dysfunction. Her most recent brain scan was normal outside of a "small" infarct in the left cerebellar hemisphere and contained no reported evidence for any degree of microvascular ischemic disease. As this infarct was described as chronic (i.e., happening in the likely distant past) rather than sub-acute (i.e., happening in the past several weeks), it seems unlikely that this would have occurred in the 2.5 weeks between her report of symptom onset  (one week after surgery) and when her brain MRI took place. However, I do acknowledge the fact that this cannot be definitively stated. Generally speaking, cerebellar injuries are primarily associated with deficits in balance and coordinated movement rather than pronounced cognitive decline. While some variability and mild dysfunction could be argued surrounding processing speed and attention/concentration in this region, the size of her event would suggest that these would be minimal and the anatomical location of this event would absolutely not account for the extent of reported memory loss.   Unfortunately, I do not have a direct explanation for Hannah Ray reported symptoms as many of them are abnormal (e.g., being able to understand technical literature but not literary works of fiction) and not consistently described in individuals with significant stroke histories or even neurodegenerative illnesses. Many of her memory complaints appear to focus on what would be deemed long-term memory (e.g., forgetting what she has previously authored and published, forgetting gaps in time from up to five years prior). Long-term memory dysfunction is primarily associated with psychiatric and somatoform etiologies, as well as individuals with prior traumatic experiences. Even in neurodegenerative illnesses such as Alzheimer's disease where severe memory impairment is expected, this focuses on short-term memory and long-term memory processes are reasonably preserved until late in the disease process.  Across a comprehensive personality measure, Hannah Ray unfortunately elevated the positive impression management symptom validity scale, suggesting that she portrayed herself in an overly favorable light while filling out this measure. Despite this, she still came very close to elevating the somatic complaints clinical subscale as she reported ongoing concerns surrounding physical functioning and health matters, primarily with  sensory and motor dysfunction. Severe somatic preoccupation can certainly impact cognitive functioning and certainly could yield nonspecific patterns of variability as she exhibited across testing. Response patterns across this questionnaire were said to be most consistent with a conversion disorder and  her responses did score in the 99.9th percentile across the conversion facet within the somatic complaints subscale. A conversion disorder (also known as a functional neurologic disorder) is broadly defined as a psychiatric condition characterized by nervous system symptoms that cannot be explained by a neurological disease or other medical condition. However, symptoms appear real to the individual and can create significant distress and problems functioning in day-to-day life. Commonly reported symptoms include much of what Hannah Ray described, including subjective cognitive and memory complaints, physical weakness, numbness, vision changes/dysfunction, and other physical/motor symptoms. This etiology remains plausible.   It should also be highlighted that performance variability across testing could be influenced by the presence of untreated sleep apnea (see below).   Recommendations: Primary treatment for a conversion disorder is psychiatric in nature and focuses on the introduction of psychotherapy. As such, Hannah Ray is encouraged to engage in outpatient short-term psychotherapy to address symptoms of psychiatric distress. Cognitive behavioral therapy (CBT) has been shown to be very effective in these cases and I would encourage her to seek out a therapist who operates within this framework. Techniques to help reduce stress will also likely be beneficial.   Hannah Ray reported prominent snoring which will cause her to wake up gasping for air. Her husband also noted times where she temporarily stops breathing while asleep. All of these are significant red flags for the presence of obstructive sleep apnea. I would  strongly encourage her to discuss a referral for a laboratory sleep study with her PCP.   Hannah Ray is encouraged to attend to lifestyle factors for brain health (e.g., regular physical exercise, good nutrition habits, regular participation in cognitively-stimulating activities, and general stress management techniques), which are likely to have benefits for both emotional adjustment and cognition. In fact, in addition to promoting good general health, regular exercise incorporating aerobic activities (e.g., brisk walking, jogging, cycling, etc.) has been demonstrated to be a very effective treatment for depression and stress, with similar efficacy rates to both antidepressant medication and psychotherapy. Optimal control of vascular risk factors (including safe cardiovascular exercise and adherence to dietary recommendations) is encouraged. Continued participation in activities which provide mental stimulation and social interaction is also recommended.   If interested, there are some activities which have therapeutic value and can be useful in keeping her cognitively stimulated. For suggestions, Hannah Ray is encouraged to go to the following website: https://www.[facility].org/get-to-know-barrow/centers-programs/neurorehabilitation-center/neuro-rehab-apps-and-games/ which has options, categorized by level of difficulty. It should be noted that these activities should not be viewed as a substitute for therapy.  When learning new information, she would benefit from information being broken up into small, manageable pieces. She may also find it helpful to articulate the material in her own words and in a context to promote encoding at the onset of a new task. This material may need to be repeated multiple times to promote encoding.  Memory can be improved using internal strategies such as rehearsal, repetition, chunking, mnemonics, association, and imagery. External strategies such as written notes in a  consistently used memory journal, visual and nonverbal auditory cues such as a calendar on the refrigerator or appointments with alarm, such as on a cell phone, can also help maximize recall.    Review of Records:   Hannah Ray was seen by [facility] Shon Millet, D.O.) on 04/26/2022 for an evaluation of word finding difficulty, left arm numbness, and prior stroke. Briefly, Hannah Ray underwent exploratory laparoscopic surgery on 02/24/2022 for chronic inguinal pain in which she underwent right  triple neurectomy and lysis of adhesions, right femoral hernia repair, and left inguinal hernia repair. When she woke up from surgery, her left arm was numb and she reported cognitive problems. She was seen in the ED on 03/22/2022 for further evaluation. Brain MRI at that time revealed a small chronic left cerebellar infarct but was otherwise unremarkable. Cervical spine MRI revealed central to left paracentral disc protrusions at C2-3 through C5-6 with mild spinal stenosis and disc bulge causing mild left C5 and C6 foraminal stenosis but no abnormalities involving the spinal cord. She followed up with her PCP who performed further lab workup, all of which was unremarkable. At that time, she continued to report intermittent numbness in the left arm involving the last three digits and radiating up to the shoulder. It is not positional nor associated with pain. She does feel like she has decreased dexterity, drops objects, and has difficulty typing. Neurosurgery reportedly informed her that symptoms were not originating from her neck.   Regarding cognitive dysfunction, she reported ongoing memory decline. 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 She also reported trouble with sustained attention, multi-tasking, and word finding. She further reported being able to follow technical literature  while reading but is unable to comprehend fictional works. Performance on a brief cognitive screening instrument (SLUMS) was 25/30. Ultimately, Hannah Ray was referred for a comprehensive neuropsychological evaluation to characterize her cognitive abilities and to assist with diagnostic clarity and treatment planning.   Medical notation surrounding her July 2023 surgical procedure did not report any hypotensive events or complications of any kind. Head CT on 03/22/2022 was negative for any acute intracranial abnormalities. Brain MRI on 03/22/2022 revealed a small chronic infarct within the left cerebellar hemisphere. No other microvascular disease or encephalomalacia was noted. No age advanced or lobar predominant parenchymal atrophy was noted. NCV/EMG testing on 05/18/2022 was within normal limits.   Past Medical History:  Diagnosis Date   Asthma    BMI 35.0-35.9,adult 12/28/2021   Bulging of cervical intervertebral disc 04/10/2022   Complaints of memory disturbance 02/24/2022   Essential (primary) hypertension 08/18/2021   IBS (irritable bowel syndrome)    Inguinal pain, right 12/28/2021   Lacunar infarction    left cerebellar hemisphere   Migraines    PONV (postoperative nausea and vomiting)    Scope patch helps   Situational anxiety 08/18/2021   Vitamin D deficiency 08/18/2021    Past Surgical History:  Procedure Laterality Date   CESAREAN SECTION  2002   w/ tubal ligation   CHOLECYSTECTOMY  1997   HERNIA REPAIR  2004   INGUINAL HERNIA REPAIR Bilateral 02/24/2022   Procedure: LAPAROSCOPIC ABDOMINAL LYSIS OF ADHESIONS, RIGHT TRIPLE NEURECTOMY, RIGHT FEMORAL L HERNIA REPAIR, LEFT INGUINAL HERNIA  REPAIR WITH MESH TAP BLOCK;  Surgeon: Karie Soda, MD;  Location: [facility];  Service: General;  Laterality: Bilateral;   LAPAROSCOPY N/A 02/24/2022   Procedure: LAPAROSCOPY DIAGNOSTIC;  Surgeon: Karie Soda, MD;  Location: [facility];  Service: General;   Laterality: N/A;   TUBAL LIGATION      Current Outpatient Medications:    acetaminophen (TYLENOL) 325 MG tablet, Take 2 tablets every 6 hours by oral route., Disp: , Rfl:    albuterol (VENTOLIN HFA) 108 (90 Base) MCG/ACT inhaler, Inhale into the lungs every 6 (six) hours as needed for wheezing or shortness of breath., Disp: , Rfl:  Cholecalciferol (VITAMIN D-1000 MAX ST) 25 MCG (1000 UT) tablet, Take by mouth., Disp: , Rfl:    gabapentin (NEURONTIN) 100 MG capsule, Take 100 mg by mouth 3 (three) times daily., Disp: , Rfl:    hydrochlorothiazide (HYDRODIURIL) 25 MG tablet, Take 25 mg by mouth daily., Disp: , Rfl:    valsartan (DIOVAN) 40 MG tablet, Take 1 tablet (40 mg total) by mouth daily., Disp: 90 tablet, Rfl: 2  Clinical Interview:   The following information was obtained during a clinical interview with Hannah Ray and her husband prior to cognitive testing.  Cognitive Symptoms: Decreased short-term memory: Endorsed. She described a variety of ongoing memory dysfunction. 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  Decreased long-term memory: Endorsed (see above). Medical records suggest prior reporting surrounding trouble recalling events from up to five years prior.  Decreased attention/concentration: Endorsed. She reported ongoing trouble with sustained attention and increased distractibility.  Reduced processing speed: Endorsed. She described her mind as "foggy" and moving in "slow-motion." Difficulties with executive functions: Endorsed. She reported trouble with multi-tasking, organization, and problem solving more broadly. She noted that she tries to  no longer make decisions as she does not trust her decision making capabilities. She and her husband denied trouble with impulsivity or any significant personality changes surrounding disinhibition or dysregulation.  Difficulties with emotion regulation: Denied. Difficulties with receptive language: Denied. Difficulties with word finding: Endorsed. At one point in the more acute stages following her surgical procedure, she stated that she would get "stuck," being able to see words in her mind but unable to articulate herself. For a period of time, this could only be remedied by stating various expletives. This has subsided over time. Her husband also noted that her rate of speech has seemed decreased since her procedure.  Decreased visuoperceptual ability: Endorsed. She reported bumping into objects in her environment and some residual decline in visual acuity and/or eye functioning since her surgical procedure. She noted recently seeing an eye specialist who reportedly did not find any anatomical abnormalities.   Trajectory of deficits: Prior to her July 2023 surgical procedure, no cognitive and/or functional difficulties were reported. She denied cognitive complaints in the week after her procedure. However, during the second week, she reported the emergence of gaps in memory and ongoing functional impairment. This has persisted to present day.   Difficulties completing ADLs: Largely denied. She reported benefiting from the utilization of calendars and reminders to help with medication and financial management. She reported driving locally but does not go more than 7-10 miles from her home due to her not being trustful of her driving abilities.   Additional Medical History: History of traumatic brain injury/concussion: Denied. History of stroke: Endorsed (see above). Of unknown relevance, medical records suggest that while in labor with her son in 1999, she reportedly developed dangerously high blood  pressure. She gave birth and her blood pressure normalized but she reported transient numbness and weakness in her left arm and leg. Formal neuroimaging was not pursued.  History of seizure activity: Denied. History of known exposure to toxins: Denied. Symptoms of chronic pain: Endorsed. Prior to her procedure, she reported a 20-year history of chronic pain. This was said  to lead to her July 2023 surgery as a means of hopeful improvements in pain management. More recently, she reported some right-sided pain which has been managed somewhat effectively with gabapentin.  Experience of frequent headaches/migraines: Denied. She did report a history of remote blood pressure related headaches in the 1990s.  Frequent instances of dizziness/vertigo: Denied.  Sensory changes: She wears glasses with benefit. Other sensory changes/difficulties (e.g., hearing, taste, smell) were denied.  Balance/coordination difficulties: Endorsed. She reported ongoing instability and that she commonly veers to the right while ambulating. She reported being unaware of this until she impacts furniture or nears a wall sooner than expected. The cause for this was unknown; however, they did allude to suspicions surrounding her stroke history.  Other motor difficulties: Denied.  Sleep History: Estimated hours obtained each night: 6-7 hours.  Difficulties falling asleep: Denied. Her husband reported remote insomnia, largely due to chronic pain symptoms.  Difficulties staying asleep: Denied. Feels rested and refreshed upon awakening: Endorsed.  History of snoring: Endorsed. History of waking up gasping for air: Endorsed. Witnessed breath cessation while asleep: Endorsed. However, she denied ever being referred for or completing a laboratory sleep study or being diagnosed with obstructive sleep apnea.   History of vivid dreaming: Denied. Excessive movement while asleep: Denied. Instances of acting out her dreams:  Denied.  Psychiatric/Behavioral Health History: Depression: She acknowledged some ongoing and more acute sadness stemming from feeling as though her brain no longer works as it used to. She did not describe herself as depressed currently and denied to her knowledge prior mental health concerns or diagnoses surrounding depressed mood prior to her July 2023 procedure.  Anxiety: Endorsed. Symptoms were described as situational, largely surrounding traveling (e.g., driving on busy roads). Her husband noted that anxiety symptoms seem to have actually improved since her procedure rather than escalated.  Mania: Denied. Trauma History: Denied. Visual/auditory hallucinations: Denied. Delusional thoughts: Denied.  Tobacco: Endorsed. She reported using a vape pen daily.  Alcohol: She denied current alcohol consumption as well as a history of problematic alcohol abuse or dependence.  Recreational drugs: Denied.  Family History: Problem Relation Age of Onset   Depression Mother    Alcoholism Mother    Drug abuse Mother    Dementia Paternal Grandfather    Stroke Paternal Grandfather    Dementia Paternal Aunt    Seizures Cousin    This information was confirmed by Hannah Ray.  Academic/Vocational History: Highest level of educational attainment: 14 years. She graduated from high school and earned an Associate's degree. 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 She alluded to difficulties in academic settings throughout high school and earlier settings but attributed this to negative family dynamics and other factors rather than intellectual abilities. Math was noted as a potential relative weakness.  History of developmental delay: Denied. History of grade repetition: Denied. Enrollment in special education courses: Denied. History of LD/ADHD: Denied.  Employment: Prior to her July 2023 procedure, she worked for [employer] in Engineer, building services  capacities. She reported being in the process of applying for long-term disability given perceived cognitive decline in the aftermath of her procedure. She was hopeful that this would be temporary and expressed a desire to work in the future after getting her back on her feet. She described memory dysfunction causing her to completely forget work responsibilities and the many brands she would work with frequently.   Evaluation Results:   Behavioral Observations: Hannah Ray was accompanied  by her husband, arrived to her appointment on time, and was appropriately dressed and groomed. She appeared alert and oriented. Observed gait and station were within normal limits. Gross motor functioning appeared intact upon informal observation and no abnormal movements (e.g., tremors) were noted. Her affect was generally relaxed and positive, but did range appropriately given the subject being discussed during the clinical interview or the task at hand during testing procedures. She did become tearful when discussing being out of work and wishing to return in the future. Spontaneous speech was fluent and word finding difficulties were not observed during the clinical interview. Thought processes were coherent, organized, and normal in content. Insight into her cognitive difficulties appeared adequate.   During testing, sustained attention was appropriate. Task engagement was adequate and she persisted when challenged. Overall, Ms. Vonna KotykJay was cooperative with the clinical interview and subsequent testing procedures.  Adequacy of Effort: The validity of neuropsychological testing is limited by the extent to which the individual being tested may be assumed to have exerted adequate effort during testing. Ms. Vonna KotykJay expressed her intention to perform to the best of her abilities and exhibited adequate task engagement and persistence. Scores across stand-alone and embedded performance validity measures were within expectation. As  such, the results of the current evaluation are believed to be a valid representation of Ms. Katherina MiresJay's current cognitive functioning.  Test Results: Ms. Vonna KotykJay was fully oriented at the time of the current evaluation.  Intellectual abilities based upon educational and vocational attainment were estimated to be in the average range. Premorbid abilities were estimated to be within the above average range based upon a single-word reading test. Performances across a grouping of subtests used to estimate her full-scale IQ scored in the average range.    Processing speed was variable, ranging from the well below average to average normative ranges. Basic attention was average. More complex attention (e.g., working memory) was above average. Executive functioning was average to above average.  While not directly assessed, receptive language abilities were believed to be intact. Likewise, Ms. Vonna KotykJay did not exhibit any difficulties comprehending task instructions and answered all questions asked of her appropriately. Assessed expressive language (e.g., verbal fluency and confrontation naming) was average to exceptionally high.     Assessed visuospatial/visuoconstructional abilities were variable but overall appropriate, ranging from the below average to well above average normative ranges.    Learning (i.e., encoding) of novel verbal and visual information was mildly variable but overall appropriate, ranging from the below average to above average normative ranges. Spontaneous delayed recall (i.e., retrieval) of previously learned information was more variable, ranging from the well below average to average normative ranges. Retention rates were 78% across a story learning task, 100% across a list learning task, and 100% across a shape learning task. Performance across recognition tasks was well below average across a visual task but average across two other tasks, suggesting some evidence for information  consolidation.   Results of emotional screening instruments suggested that recent symptoms of generalized anxiety were in the minimal range, while symptoms of depression were within normal limits range. Across a more comprehensive personality inventory, she elevated the positive impression management scale, invalidating the instrument. A screening instrument assessing recent sleep quality suggested the presence of minimal sleep dysfunction.  Tables of Scores:   Note: This summary of test scores accompanies the interpretive report and should not be considered in isolation without reference to the appropriate sections in the text. Descriptors are based on appropriate normative data and  may be adjusted based on clinical judgment. Terms such as "Within Normal Limits" and "Outside Normal Limits" are used when a more specific description of the test score cannot be determined.       Percentile - Normative Descriptor > 98 - Exceptionally High 91-97 - Well Above Average 75-90 - Above Average 25-74 - Average 9-24 - Below Average 2-8 - Well Below Average < 2 - Exceptionally Low       Orientation:      Raw Score Percentile   NAB Orientation, Form 1 29/29 --- ---       Intellectual Functioning:      Standard Score Percentile   Test of Premorbid Functioning: 116 86 Above Average       Wechsler Adult Intelligence Scale (WAIS-IV) Short Form*: Standard Score/ Scaled Score Percentile   Full Scale IQ  90 25 Average    Vocabulary 12 75 Above Average    Visual Puzzles 6 9 Below Average    Arithmetic 9 37 Average    Coding 7 16 Below Average  *From [employer] (2009)          Memory:     NAB Memory Module, Form 1: Standard Score/ T Score Percentile   Total Memory Index 85 16 Below Average  List Learning       Total Trials 1-3 24/36 (46) 34 Average    List B 4/12 (43) 25 Average    Short Delay Free Recall 7/12 (42) 21 Below Average    Long Delay Free Recall 7/12 (43) 25 Average    Retention  Percentage 100 (50) 50 Average    Recognition Discriminability 8 (48) 42 Average  Shape Learning       Total Trials 1-3 16/27 (45) 31 Average    Delayed Recall 6/9 (46) 34 Average    Retention Percentage 100 (49) 46 Average    Recognition Discriminability 5 (30) 2 Well Below Average  Story Learning       Immediate Recall 57/80 (41) 18 Below Average    Delayed Recall 25/40 (34) 5 Well Below Average    Retention Percentage 78 (33) 5 Well Below Average  Daily Living Memory       Immediate Recall 48/51 (59) 82 Above Average    Delayed Recall 13/17 (36) 8 Well Below Average    Retention Percentage 76 (36) 8 Well Below Average    Recognition Hits 9/10 (47) 38 Average       Attention/Executive Function:     Trail Making Test (TMT): Raw Score (T Score) Percentile     Part A 24 secs.,  0 errors (53) 62 Average    Part B 49 secs.,  1 error (54) 66 Average         Scaled Score Percentile   WAIS-IV Coding: 7 16 Below Average       NAB Attention Module, Form 1: T Score Percentile     Digits Forward 54 66 Average    Digits Backwards 60 84 Above Average        Scaled Score Percentile   WAIS-IV Similarities: 10 50 Average       D-KEFS Color-Word Interference Test: Raw Score (Scaled Score) Percentile     Color Naming 35 secs. (7) 16 Below Average    Word Reading 32 secs. (4) 2 Well Below Average    Inhibition 64 secs. (8) 25 Average      Total Errors 3 errors (7) 16 Below Average    Inhibition/Switching 58  secs. (11) 63 Average      Total Errors 0 errors (12) 75 Above Average       D-KEFS Verbal Fluency Test: Raw Score (Scaled Score) Percentile     Letter Total Correct 43 (11) 63 Average    Category Total Correct 64 (19) >99 Exceptionally High    Category Switching Total Correct 15 (11) 63 Average    Category Switching Accuracy 14 (12) 75 Above Average      Total Set Loss Errors 0 (13) 84 Above Average      Total Repetition Errors 2 (10) 50 Average       Language:     Verbal  Fluency Test: Raw Score (T Score) Percentile     Phonemic Fluency (FAS) 43 (49) 46 Average    Animal Fluency 33 (70) 98 Well Above Average        NAB Language Module, Form 1: T Score Percentile     Naming 31/31 (54) 66 Average       Visuospatial/Visuoconstruction:     NAB Spatial Module, Form 1: T Score Percentile     Figure Drawing Copy 69 97 Well Above Average        Scaled Score Percentile   WAIS-IV Block Design: 13 84 Above Average       Mood and Personality:      Raw Score Percentile   Beck Depression Inventory - II: 11 --- Within Normal Limits  PROMIS Anxiety Questionnaire: 10 --- None to Slight       Personality Assessment Inventory: T Score  Percentile     Inconsistency 49 --- Within Normal Limits    Infrequency 59 --- Within Normal Limits    Negative Impression 44 --- Within Normal Limits    Positive Impression 70 --- Invalid    Somatic Complaints 67 --- Within Normal Limits      Conversion 87 --- ---      Somatization 49 --- ---      Health Concerns 62 --- ---    Anxiety 42 --- Within Normal Limits    Anxiety-Related Disorders 43 --- Within Normal Limits    Depression 51 --- Within Normal Limits    Mania 39 --- Within Normal Limits    Paranoia 39 --- Within Normal Limits    Schizophrenia 62 --- Within Normal Limits    Borderline Features 38 --- Within Normal Limits    Antisocial Features 39 --- Within Normal Limits    Alcohol Problems 41 --- Within Normal Limits    Drug Problems 42 --- Within Normal Limits    Aggression 41 --- Within Normal Limits    Suicidal Ideation 49 --- Within Normal Limits    Stress 39 --- Within Normal Limits    Non Support 37 --- Within Normal Limits    Treatment Rejection 61 --- Within Normal Limits    Dominance 33 --- Within Normal Limits    Warmth 49 --- Within Normal Limits       Additional Questionnaires:      Raw Score Percentile   PROMIS Sleep Disturbance Questionnaire: 8 --- None to Slight   Informed Consent and  Coding/Compliance:   The current evaluation represents a clinical evaluation for the purposes previously outlined by the referral source and is in no way reflective of a forensic or disability determination evaluation.   Hannah Ray was provided with a verbal description of the nature and purpose of the present neuropsychological evaluation. Also reviewed were the foreseeable risks and/or discomforts and benefits  of the procedure, limits of confidentiality, and mandatory reporting requirements of this provider. The patient was given the opportunity to ask questions and receive answers about the evaluation. Oral consent to participate was provided by the patient.   This evaluation was conducted by Newman Nickels, Ph.D., ABPP-CN, board certified clinical neuropsychologist. Ms. Goodnough completed a clinical interview with Dr. Milbert Coulter, billed as one unit [phone], and 165 minutes of cognitive testing and scoring, billed as one unit [phone] and five additional units [id number]. Psychometrist Wallace Keller, B.S., assisted Dr. Milbert Coulter with test administration and scoring procedures. As a separate and discrete service, Dr. Milbert Coulter spent a total of 160 minutes in interpretation and report writing billed as one unit [phone] and two units 96133.

## 2022-05-19 NOTE — Progress Notes (Signed)
   Psychometrician Note   Cognitive testing was administered to Hannah Ray by Hannah Ray, B.S. (psychometrist) under the supervision of Dr. Christia Ray, Ph.D., licensed psychologist on 05/19/2022. Hannah Ray did not appear overtly distressed by the testing session per behavioral observation or responses across self-report questionnaires. Rest breaks were offered.    The battery of tests administered was selected by Dr. Christia Ray, Ph.D. with consideration to Hannah Ray current level of functioning, the nature of her symptoms, emotional and behavioral responses during interview, level of literacy, observed level of motivation/effort, and the nature of the referral question. This battery was communicated to the psychometrist. Communication between Dr. Christia Ray, Ph.D. and the psychometrist was ongoing throughout the evaluation and Dr. Christia Ray, Ph.D. was immediately accessible at all times. Dr. Christia Ray, Ph.D. provided supervision to the psychometrist on the date of this service to the extent necessary to assure the quality of all services provided.    Hannah Ray will return within approximately 1-2 weeks for an interactive feedback session with Dr. Melvyn Novas at which time her test performances, clinical impressions, and treatment recommendations will be reviewed in detail. Hannah Ray understands she can contact our office should she require our assistance before this time.  A total of 165 minutes of billable time were spent face-to-face with Hannah Ray by the psychometrist. This includes both test administration and scoring time. Billing for these services is reflected in the clinical report generated by Dr. Christia Ray, Ph.D.  This note reflects time spent with the psychometrician and does not include test scores or any clinical interpretations made by Dr. Melvyn Novas. The full report will follow in a separate note.

## 2022-05-21 LAB — HORMONE PANEL (T4,TSH,FSH,TESTT,SHBG,DHEA,ETC)
DHEA-Sulfate, LCMS: 78 ug/dL
Estradiol, Serum, MS: 47 pg/mL
Estrone Sulfate: 62 ng/dL
Follicle Stimulating Hormone: 6.9 m[IU]/mL
Free T-3: 3.2 pg/mL
Free Testosterone, Serum: 3 pg/mL
Progesterone, Serum: 579 ng/dL
Sex Hormone Binding Globulin: 58.3 nmol/L
T4: 8.7 ug/dL
TSH: 1.7 uU/mL
Testosterone, Serum (Total): 25 ng/dL
Testosterone-% Free: 1.2 %
Triiodothyronine (T-3), Serum: 121 ng/dL

## 2022-05-22 ENCOUNTER — Encounter: Payer: BC Managed Care – PPO | Admitting: Physical Therapy

## 2022-05-23 ENCOUNTER — Ambulatory Visit (INDEPENDENT_AMBULATORY_CARE_PROVIDER_SITE_OTHER): Payer: BC Managed Care – PPO | Admitting: Psychology

## 2022-05-23 ENCOUNTER — Encounter: Payer: Self-pay | Admitting: Family Medicine

## 2022-05-23 DIAGNOSIS — I6381 Other cerebral infarction due to occlusion or stenosis of small artery: Secondary | ICD-10-CM

## 2022-05-23 DIAGNOSIS — R4189 Other symptoms and signs involving cognitive functions and awareness: Secondary | ICD-10-CM

## 2022-05-23 NOTE — Progress Notes (Signed)
   Neuropsychology Feedback Session Tillie Rung. [facility] Department of Neurology  Reason for Referral:   Hannah Ray is a 46 y.o. right-handed Caucasian female referred by Metta Clines, D.O., to characterize her current cognitive functioning and assist with diagnostic clarity and treatment planning in the context of subjective cognitive decline.   Feedback:   Hannah Ray completed a comprehensive neuropsychological evaluation on 05/19/2022. Please refer to that encounter for the full report and recommendations. Briefly, results suggested isolated variability primarily surrounding processing speed and delayed retrieval aspects of memory. Additional variability was noted across visuospatial abilities and encoding (i.e., learning) aspects of memory; however, these performances were largely appropriate relative to age-matched peers. No cognitive domains exhibited consistent impairment. Presently, it is my opinion that current performances do not strongly suggest an underlying neurological contribution to subjective day-to-day dysfunction. Her most recent brain scan was normal outside of a "small" infarct in the left cerebellar hemisphere and contained no reported evidence for any degree of microvascular ischemic disease. As this infarct was described as chronic (i.e., happening in the likely distant past) rather than sub-acute (i.e., happening in the past several weeks), it seems unlikely that this would have occurred in the 2.5 weeks between her report of symptom onset (one week after surgery) and when her brain MRI took place. However, I do acknowledge the fact that this cannot be definitively stated. Generally speaking, cerebellar injuries are primarily associated with deficits in balance and coordinated movement rather than pronounced cognitive decline. While some variability and mild dysfunction could be argued surrounding processing speed and attention/concentration in this region, the  size of her event would suggest that these would be minimal and the anatomical location of this event would absolutely not account for the extent of reported memory loss.   Hannah Ray was accompanied by her husband during the current feedback session. Content of the current session focused on the results of her neuropsychological evaluation. Hannah Ray was given the opportunity to ask questions and her questions were answered. She was encouraged to reach out should additional questions arise. A copy of her report was provided at the conclusion of the visit.      30 minutes were spent conducting the current feedback session with Hannah Ray, billed as one unit [phone].

## 2022-05-24 ENCOUNTER — Ambulatory Visit: Payer: BC Managed Care – PPO | Admitting: Occupational Therapy

## 2022-05-26 DIAGNOSIS — N3941 Urge incontinence: Secondary | ICD-10-CM | POA: Diagnosis not present

## 2022-06-07 ENCOUNTER — Ambulatory Visit: Payer: BC Managed Care – PPO | Attending: Surgery | Admitting: Occupational Therapy

## 2022-06-07 DIAGNOSIS — M6281 Muscle weakness (generalized): Secondary | ICD-10-CM | POA: Diagnosis not present

## 2022-06-07 DIAGNOSIS — R41840 Attention and concentration deficit: Secondary | ICD-10-CM | POA: Insufficient documentation

## 2022-06-07 DIAGNOSIS — R278 Other lack of coordination: Secondary | ICD-10-CM | POA: Diagnosis not present

## 2022-06-07 NOTE — Addendum Note (Signed)
Addended by: Kathrine Cords on: 06/07/2022 08:15 AM   Modules accepted: Orders

## 2022-06-07 NOTE — Therapy (Unsigned)
OUTPATIENT OCCUPATIONAL THERAPY TREATMENT NOTE  Patient Name: Hannah Ray MRN: [id number] DOB:[birth year], 46 y.o., female Today's Date: 06/07/2022  PCP: Baruch Gouty, FNP REFERRING PROVIDER: Baruch Gouty, [place]   OT End of Session - 06/07/22 [phone]     Visit Number 2    Number of Visits 7    Date for OT Re-Evaluation 07/07/22    Authorization Type BCBS    Authorization Time Period VL: 76 (combined OT/PT)    OT Start Time 0805    OT Stop Time 0845    OT Time Calculation (min) 40 min    Activity Tolerance Patient tolerated treatment well    Behavior During Therapy [facility] for tasks assessed/performed            Past Medical History:  Diagnosis Date   Asthma    BMI 35.0-35.9,adult 12/28/2021   Bulging of cervical intervertebral disc 04/10/2022   Complaints of memory disturbance 02/24/2022   Essential (primary) hypertension 08/18/2021   IBS (irritable bowel syndrome)    Inguinal pain, right 12/28/2021   Lacunar infarction    left cerebellar hemisphere   Migraines    PONV (postoperative nausea and vomiting)    Scope patch helps   Situational anxiety 08/18/2021   Vitamin D deficiency 08/18/2021   Past Surgical History:  Procedure Laterality Date   CESAREAN SECTION  2002   w/ tubal ligation   CHOLECYSTECTOMY  1997   HERNIA REPAIR  2004   INGUINAL HERNIA REPAIR Bilateral 02/24/2022   Procedure: LAPAROSCOPIC ABDOMINAL LYSIS OF ADHESIONS, RIGHT TRIPLE NEURECTOMY, RIGHT FEMORAL L HERNIA REPAIR, LEFT INGUINAL HERNIA  REPAIR WITH MESH TAP BLOCK;  Surgeon: Michael Boston, MD;  Location: [place];  Service: General;  Laterality: Bilateral;   LAPAROSCOPY N/A 02/24/2022   Procedure: LAPAROSCOPY DIAGNOSTIC;  Surgeon: Michael Boston, MD;  Location: [place];  Service: General;  Laterality: N/A;   TUBAL LIGATION     Patient Active Problem List   Diagnosis Date Noted   Lacunar infarction 05/19/2022   Bulging of cervical intervertebral disc  04/10/2022   Subjective cognitive impairment 02/24/2022   BMI 35.0-35.9,adult 12/28/2021   Inguinal pain, right 12/28/2021   Situational anxiety 08/18/2021   Asthma 08/18/2021   Essential (primary) hypertension 08/18/2021   Vitamin D deficiency 08/18/2021    ONSET DATE: 04/11/2022  REFERRING DIAG: C94.70 (ICD-10-CM) - History of stroke R20.2 (ICD-10-CM) - Paresthesia R41.89 (ICD-10-CM) - Brain fog R47.01 (ICD-10-CM) - Expressive aphasia R41.3 (ICD-10-CM) - Memory loss   THERAPY DIAG:  Muscle weakness (generalized)  Other lack of coordination  Attention and concentration deficit  Rationale for Evaluation and Treatment Rehabilitation  SUBJECTIVE:   SUBJECTIVE STATEMENT: Pt reports she had scheduled neuropsych evaluation on 10/6 and received the results shortly after; states Dr. Melvyn Novas believes etiology to be related to conversion disorder and recommended psychotherapy/CBT, but she is going to try getting a full-body MRI to rule out potential MS w/ her PCP before starting therapy. Pt accompanied by: self  PERTINENT HISTORY: L-sided weakness; decreased coordination; brain fog and difficulty concentrating Per [place] office visit w/ Darla Lesches, FNP on 04/11/22: "...ongoing neurological symptoms post surgery 02/24/2022. Pt has a complicated medical history. She had an open hernia repair in 2007 which caused ongoing RLQ and right groin pain. She was eventually referred to Dr. Johney Maine for exploratory laparoscopic surgery which she had on 02/24/2022. During procedure she had right femoral hernia repair, left inguinal hernia repair, lysis of adhesions, and right triple neurectomy.  States after this surgery she developed left sided weakness, memory loss, aphasia, brain fog, and headaches when concentrating. She went to the ED on 03/22/2022. 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 She was  referred to neurosurgery and neurology. Has appointments scheduled in November. She continues to have issues with speech and putting thoughts together, left sided weakness, coordination issues, and memory loss."  PRECAUTIONS: None  WEIGHT BEARING RESTRICTIONS: No  PAIN: Are you having pain? No  FALLS: Has patient fallen in last 6 months? No  LIVING ENVIRONMENT: Lives with: lives with their family Lives in: House/apartment Stairs: No; has stairs to enter Has following equipment at home: None  PLOF: Independent, Vocation/Vocational requirements: currently on short-term disability, applying for long-term; was full-time consumer support (computer work, some heavy lifting), and Leisure: Clinical cytogeneticist, Research officer, political party  PATIENT GOALS: Improve memory and return to PLOF   OBJECTIVE:   HAND DOMINANCE: Right  ADLs: Overall ADLs: Independent w/ BADLs; able to complete clothing manipulatives w/ extended time  SENSATION: Reports numbness along ulnar nerve distribution of LUE  COGNITION: Overall cognitive status: Within functional limits for tasks assessed Pt reports concerns w/ memory, both short-term memory and working [employer] Exam: 26/30 Difficulty w/ delayed recall w/ interference and immediate recall  TODAY'S TREATMENT:   Memory Compensatory Strategies Education provided regarding compensatory/adaptive strategies related to limitations w/ memory, including: use of 'WARM' strategy for recall, using a calendar/planner, incorporating visual cues (basket or board by the door, sticky notes in places where specific tasks are performed, etc.), and use of alarms and/or timers. OT discussed pt-specific examples and problem-solved w/ pt to identify methods of potential options for incorporating strategies into routines prn.  Putty Exercises Gross grasp of putty w/ each hand 20x, focusing on attempting pattern against resistance opposed to straining to force grasp  Thumb-to-finger opposition  completed 3x along length of rolled putty w/ each hand  Pinch-and-twist of putty to pull off a small piece and roll into a ball using fingertips only; completed 10x w/ each hand    PATIENT EDUCATION: See tx section above Person educated: Patient and Spouse Education method: Explanation Education comprehension: verbalized understanding   HOME EXERCISE PROGRAM: MedBridge Access Code: [email or web address] URL: [email or web address]  - Composite Flexion with Putty  - 2 x daily - 1 sets - 25 reps - Thumb Opposition with Putty  - 2 x daily - 4-6 sets - Tip Pinch with Putty  - 2 x daily - 1 sets - 10 reps   GOALS: Goals reviewed with patient? No  SHORT TERM GOALS: Target date: 06/07/22    Status:  1 Pt will verbalize understanding of at least 2 memory compensatory strategies and be able to identify pt-specific examples of options to incorporate strategies into daily routines  Met - 06/07/22  2 Pt will demonstrate independence w/ HEP designed for coordination, sensation, and LUE/hand strengthening  Progressing    LONG TERM GOALS: Target date: 07/07/22    Status:  1 Pt will improve efficiency during functional fine motor tasks (e.g., buttons, ties/laces) by decreasing time to complete 9-HPT w/ L hand by at least 3 sec  Baseline: Right: 24.88 sec; Left: 34.30 sec Progressing  2 Pt will increase grip strength in L hand to at least 34 lbs for improved bilateral use at home and during higher level IADLs  Baseline: Right: 63 lbs; Left: 24 lbs Progressing  3 Pt will demonstrate simulated meal prep,  or report being able to complete meal prep activity at home w/ at least Mod Ind, incorporating compensatory strategies and AE as needed, by discharge  Progressing    ASSESSMENT:  CLINICAL IMPRESSION: Pt arrives for first treatment session following initial evaluation 4 weeks ago on 05/10/22. Since prior visit, pt has filed for long-term disability, received NCS/EMG, and seen neurology for  neuropsych testing w/ no definitive results. For session today, OT reviewed current level of participation in ADLs and discussed goals w/ ptm who is agreeable to plan of care at this time, before introducing memory compensatory strategies. Pt continues to c/o difficulty w/ recall of information, but self-reports she has identified some strategies already that are helpful for her. OT further problem-solved potentially beneficial options w/ pt verbalizing understanding. OT also introduced putty exercises into HEP, focusing on hand strengthening and [facility] and coordination w/ pt able to return demonstration w/out difficulty. Med-soft red therapy putty administered to pt at conclusion of session.  PERFORMANCE DEFICITS in functional skills including coordination, strength, body mechanics, endurance, and UE functional use, cognitive skills including memory and problem solving.   IMPAIRMENTS are limiting patient from ADLs, IADLs, work, leisure, and social participation.   COMORBIDITIES may have co-morbidities  that affects occupational performance. Patient will benefit from skilled OT to address above impairments and improve overall function.   PLAN: OT FREQUENCY: 1x/week  OT DURATION: 8 weeks  PLANNED INTERVENTIONS: self care/ADL training, therapeutic exercise, therapeutic activity, neuromuscular re-education, manual therapy, functional mobility training, electrical stimulation, ultrasound, moist heat, cryotherapy, patient/family education, cognitive remediation/compensation, energy conservation, and DME and/or AE instructions  RECOMMENDED OTHER SERVICES: SLP  CONSULTED AND AGREED WITH PLAN OF CARE: Patient  PLAN FOR NEXT SESSION: Continue w/ HEP (review/add putty exercises; coordination activities)   Kathrine Cords, MSOT, OTR/L 06/07/2022, 9:24 AM

## 2022-06-15 ENCOUNTER — Ambulatory Visit: Payer: BC Managed Care – PPO | Admitting: Occupational Therapy

## 2022-06-20 ENCOUNTER — Ambulatory Visit: Payer: BC Managed Care – PPO | Admitting: Occupational Therapy

## 2022-06-27 ENCOUNTER — Ambulatory Visit: Payer: BC Managed Care – PPO | Admitting: Neurology

## 2022-07-31 ENCOUNTER — Encounter: Payer: Self-pay | Admitting: *Deleted

## 2022-08-04 ENCOUNTER — Encounter: Payer: Self-pay | Admitting: Occupational Therapy

## 2022-08-04 NOTE — Therapy (Signed)
[facility] [address] [place], [place], [address] Phone: [phone]   Fax:  [phone]  Patient Details  Name: Hannah Ray MRN: [id number] Date of Birth: 06/08/1976 Referring Provider:  No ref. provider found  Encounter Date: 08/04/2022  OCCUPATIONAL THERAPY DISCHARGE SUMMARY  Visits from Start of Care: 2  Current functional level related to goals / functional outcomes: See note from 06/07/22 for full details.  Pt did not return for further OT visits due to insurance issues.   Remaining deficits: Grip strength, [facility]   Education / Equipment: Education provided on memory compensatory strategies and putty exercises for strengthening and coordination.   Patient agrees to discharge. Patient goals were not met. Patient is being discharged due to not returning since the last visit.Marland Kitchen     Simonne Come, OT 08/04/2022, 8:08 AM  [facility] [address], [place], [place], [address] Phone: [phone]   Fax:  [phone]

## 2022-08-11 ENCOUNTER — Ambulatory Visit (INDEPENDENT_AMBULATORY_CARE_PROVIDER_SITE_OTHER): Payer: Self-pay

## 2022-08-11 ENCOUNTER — Encounter: Payer: Self-pay | Admitting: Family Medicine

## 2022-08-11 ENCOUNTER — Ambulatory Visit (INDEPENDENT_AMBULATORY_CARE_PROVIDER_SITE_OTHER): Payer: Self-pay | Admitting: Family Medicine

## 2022-08-11 VITALS — BP 132/81 | HR 68 | Temp 97.6°F | Ht 67.0 in | Wt 269.0 lb

## 2022-08-11 DIAGNOSIS — R0602 Shortness of breath: Secondary | ICD-10-CM

## 2022-08-11 DIAGNOSIS — J4521 Mild intermittent asthma with (acute) exacerbation: Secondary | ICD-10-CM

## 2022-08-11 MED ORDER — METHYLPREDNISOLONE ACETATE 80 MG/ML IJ SUSP
80.0000 mg | Freq: Once | INTRAMUSCULAR | Status: AC
  Administered 2022-08-11: 80 mg via INTRAMUSCULAR

## 2022-08-11 MED ORDER — PREDNISONE 20 MG PO TABS: 40.0000 mg | ORAL_TABLET | Freq: Every day | ORAL | 0 refills | Status: AC

## 2022-08-11 NOTE — Progress Notes (Signed)
   Acute Office Visit  Subjective:     Patient ID: Hannah Ray, female    DOB: 1976/04/10, 46 y.o.   MRN: [id number]  Chief Complaint  Patient presents with   Wheezing         HPI Patient is in today for wheezing for wheezing for 2 days. She also has had upper back pain. Reports cough x 3, sometimes productive. She has pain in the center of her upper back and chest. This is worse with a deep breath and improves with inhaler use. She has felt short of breath since yesterday. She has been around wood smoke that she is allergic too. Has been using albuterol inhaler every 4 hours for the last 2 days. Close exposure to Covid. She has had negative home tests x 2. There is a past history of pneumonia and she is very concerned that she currently has pneumonia. Denies fever or congestion.  ROS As per HPI.      Objective:    BP 132/81   Pulse 68   Temp 97.6 F (36.4 C)   Ht 5\' 7"  (1.702 m)   Wt 269 lb (122 kg)   SpO2 98%   BMI 42.13 kg/m    Physical Exam Vitals and nursing note reviewed.  Constitutional:      General: She is not in acute distress.    Appearance: She is not ill-appearing, toxic-appearing or diaphoretic.  HENT:     Head: Normocephalic and atraumatic.     Nose: Nose normal.  Cardiovascular:     Rate and Rhythm: Normal rate and regular rhythm.     Heart sounds: Normal heart sounds. No murmur heard. Pulmonary:     Effort: Pulmonary effort is normal. No respiratory distress.     Breath sounds: Normal breath sounds. No wheezing, rhonchi or rales.  Musculoskeletal:     Cervical back: No rigidity.     Right lower leg: No edema.     Left lower leg: No edema.  Lymphadenopathy:     Cervical: No cervical adenopathy.  Skin:    General: Skin is warm and dry.  Neurological:     General: No focal deficit present.     Mental Status: She is alert and oriented to person, place, and time.  Psychiatric:        Mood and Affect: Mood normal.        Behavior: Behavior  normal.        Thought Content: Thought content normal.     No results found for any visits on 08/11/22.      Assessment & Plan:   Libbi was seen today for wheezing.  Diagnoses and all orders for this visit:  Shortness of breath No signs of pneumonia on CXR. Agree with radiology report. Lung clear on exam.  -     DG Chest 2 View; Future  Mild intermittent asthma with acute exacerbation Steroid IM injection today in office. Start prednisone burst tomorrow. Continue albuterol prn. Discussed return precautions and when to seek emergency care.  -     methylPREDNISolone acetate (DEPO-MEDROL) injection 80 mg -     predniSONE (DELTASONE) 20 MG tablet; Take 2 tablets (40 mg total) by mouth daily with breakfast for 5 days. Start tomorrow   The patient indicates understanding of these issues and agrees with the plan.  Luther Parody, FNP

## 2022-08-11 NOTE — Patient Instructions (Signed)
Asthma Attack  Asthma attack, also called asthma flare or acute bronchospasm, is the sudden narrowing and tightening of the lower airways (bronchi) in the lungs, that can make it hard to breathe. The narrowing is caused by inflammation and tightening of the smooth muscle that wraps around the lower airways in the lungs. Asthma attacks may cause coughing, high-pitched whistling sounds when you breathe, most often when you breathe out (wheezing), trouble breathing (shortness of breath), and chest pain. The airways may produce extra mucus caused by the inflammation and irritation. During an asthma attack, it can be difficult to breathe. It is important to get treatment right away. Asthma attacks can range from minor to life-threatening. What are the causes? Possible causes or triggers of this condition include: Household allergens like dust, pet dander, and cockroaches. Mold and pollen from trees or grass. Air pollutants such as household cleaners, aerosol sprays, strong odors, and smoke of any kind. Weather changes and cold air. Stress or strong emotions such as crying or laughing hard. Exercise or activity that requires a lot of energy. Certain medicines or medical conditions such as: Aspirin or beta-blockers. Infections or inflammatory conditions, such as a flu (influenza), a cold, pneumonia, or inflammation of the nasal membranes (rhinitis). Gastroesophageal reflux disease (GERD). GERD is a condition in which stomach acid backs up into your esophagus and spills into your trachea (windpipe), which can irritate your airways. What are the signs or symptoms? Symptoms of this condition include: Wheezing. Excessive coughing. This may only happen at night. Chest tightness or pain. Shortness of breath. Difficulty talking in complete sentences. Feeling like you cannot get enough air, no matter how hard you breathe (air hunger). How is this diagnosed? This condition may be diagnosed based on: A  physical exam and your medical history. Your symptoms. Tests to check for other causes of your symptoms or other conditions that may have triggered your asthma attack. These tests may include: A chest X-ray. Blood tests. Lung function studies (spirometry) to evaluate the flow of air in your lungs. How is this treated? Treatment for this condition depends on the severity and cause of your asthma attack. For mild attacks, you may receive medicines through a hand-held inhaler (metered dose inhaler or MDI) or through a device that turns liquid medicine into a mist (nebulizer). These medicines include: Quick relief or rescue medicines that quickly relax the airways and lungs. Long-acting medicines that are used daily to prevent (control) your asthma symptoms. For moderate or severe attacks, you may be treated with steroid medicines by mouth or through an IV injection at the hospital. For severe attacks, you may need oxygen therapy or a breathing machine (ventilator). If your asthma attack was caused by an infection from bacteria, you will be given antibiotic medicines. Follow these instructions at home: Medicines Take over-the-counter and prescription medicines only as told by your health care provider. If you were prescribed an antibiotic medicine, take it as told by your health care provider. Do not stop using the antibiotic even if you start to feel better. Tell your doctor if you are pregnant or may be pregnant to make sure your asthma medicine is safe to use during pregnancy. Avoiding triggers  Keep track of things that trigger your asthma attacks. Avoid exposure to these triggers. Do not use any products that contain nicotine or tobacco. These products include cigarettes, chewing tobacco, and vaping devices, such as e-cigarettes. If you need help quitting, ask your health care provider. When there is a lot   of pollen, air pollution, or humidity, keep windows closed and use an air conditioner  or go to places with air conditioning. Asthma action plan Work with your health care provider to make a written plan for managing and treating your asthma attacks (asthma action plan). This plan should include: A list of your asthma triggers and how to avoid them. A list of symptoms that you may have during an asthma attack. Information about which medicine to take, when to take the medicine, and how much of the medicine to take. Information to help you understand your peak flow measurements. Daily actions that you can take to control your asthma symptoms. Contact information for your health care providers. If you have an asthma attack, act quickly. Follow the emergency steps on your written asthma action plan. This may prevent you from needing to go to the hospital. Talk to a family member or close friend about your asthma action plan and who to contact in case you need help. General instructions Avoid excessive exercise or activity until your asthma attack goes away. Stay up to date on all your vaccines, such as flu and pneumonia vaccines. Keep all follow-up visits. This is important. Contact a health care provider if: You have followed your action plan for 1 hour and your peak flow reading is still at 50-79% of your personal best. This is in the yellow zone, which means "caution." You need to use your quick reliever medicine more frequently than normal. Your medicines are causing side effects, such as rash, itching, swelling, or trouble breathing. Your symptoms do not improve after taking medicine. You have a fever. Get help right away if: Your peak flow reading is less than 50% of your personal best. This is in the red zone, which means "danger." You develop chest pain or discomfort. Your medicines no longer seem to be helping. You are coughing up bloody mucus. You have a fever and your symptoms suddenly get worse. You have trouble swallowing. You feel very tired, and breathing  becomes tiring. These symptoms may be an emergency. Get help right away. Call 911. Do not wait to see if the symptoms will go away. Do not drive yourself to the hospital. Summary Asthma attacks are caused by narrowing or tightness in air passages, which causes shortness of breath, coughing, and wheezing. Many things can trigger an asthma attack, such as allergens, weather changes, exercise, strong odors, and smoke of any kind. If you have an asthma attack, act quickly. Follow the emergency steps on your written asthma action plan. Get help right away if you have severe trouble breathing, chest pain, or fever, or if your home medicines are no longer helping with your symptoms. This information is not intended to replace advice given to you by your health care provider. Make sure you discuss any questions you have with your health care provider. Document Revised: 05/22/2021 Document Reviewed: 05/22/2021 Elsevier Patient Education  2023 [employer]  

## 2022-08-16 ENCOUNTER — Ambulatory Visit: Payer: Self-pay | Admitting: Family Medicine

## 2022-08-24 DIAGNOSIS — M546 Pain in thoracic spine: Secondary | ICD-10-CM | POA: Diagnosis not present

## 2022-08-25 ENCOUNTER — Encounter: Payer: Self-pay | Admitting: Nurse Practitioner

## 2022-08-25 ENCOUNTER — Ambulatory Visit: Payer: BC Managed Care – PPO | Admitting: Nurse Practitioner

## 2022-08-25 VITALS — BP 124/84 | HR 75 | Temp 98.2°F | Resp 20 | Ht 67.0 in | Wt 270.0 lb

## 2022-08-25 DIAGNOSIS — M546 Pain in thoracic spine: Secondary | ICD-10-CM

## 2022-08-25 MED ORDER — METHYLPREDNISOLONE ACETATE 40 MG/ML IJ SUSP
80.0000 mg | Freq: Once | INTRAMUSCULAR | Status: AC
  Administered 2022-08-25: 80 mg via INTRAMUSCULAR

## 2022-08-25 MED ORDER — PREDNISONE 20 MG PO TABS: ORAL_TABLET | ORAL | 0 refills | Status: AC

## 2022-08-25 MED ORDER — KETOROLAC TROMETHAMINE 60 MG/2ML IM SOLN
60.0000 mg | Freq: Once | INTRAMUSCULAR | Status: AC
  Administered 2022-08-25: 60 mg via INTRAMUSCULAR

## 2022-08-25 MED ORDER — METHYLPREDNISOLONE ACETATE 80 MG/ML IJ SUSP: 80.0000 mg | Freq: Once | INTRAMUSCULAR | Status: DC

## 2022-08-25 NOTE — Patient Instructions (Signed)
Acute Back Pain, Adult Acute back pain is sudden and usually short-lived. It is often caused by an injury to the muscles and tissues in the back. The injury may result from: A muscle, tendon, or ligament getting overstretched or torn. Ligaments are tissues that connect bones to each other. Lifting something improperly can cause a back strain. Wear and tear (degeneration) of the spinal disks. Spinal disks are circular tissue that provide cushioning between the bones of the spine (vertebrae). Twisting motions, such as while playing sports or doing yard work. A hit to the back. Arthritis. You may have a physical exam, lab tests, and imaging tests to find the cause of your pain. Acute back pain usually goes away with rest and home care. Follow these instructions at home: Managing pain, stiffness, and swelling Take over-the-counter and prescription medicines only as told by your health care provider. Treatment may include medicines for pain and inflammation that are taken by mouth or applied to the skin, or muscle relaxants. Your health care provider may recommend applying ice during the first 24-48 hours after your pain starts. To do this: Put ice in a plastic bag. Place a towel between your skin and the bag. Leave the ice on for 20 minutes, 2-3 times a day. Remove the ice if your skin turns bright red. This is very important. If you cannot feel pain, heat, or cold, you have a greater risk of damage to the area. If directed, apply heat to the affected area as often as told by your health care provider. Use the heat source that your health care provider recommends, such as a moist heat pack or a heating pad. Place a towel between your skin and the heat source. Leave the heat on for 20-30 minutes. Remove the heat if your skin turns bright red. This is especially important if you are unable to feel pain, heat, or cold. You have a greater risk of getting burned. Activity  Do not stay in bed. Staying in  bed for more than 1-2 days can delay your recovery. Sit up and stand up straight. Avoid leaning forward when you sit or hunching over when you stand. If you work at a desk, sit close to it so you do not need to lean over. Keep your chin tucked in. Keep your neck drawn back, and keep your elbows bent at a 90-degree angle (right angle). Sit high and close to the steering wheel when you drive. Add lower back (lumbar) support to your car seat, if needed. Take short walks on even surfaces as soon as you are able. Try to increase the length of time you walk each day. Do not sit, drive, or stand in one place for more than 30 minutes at a time. Sitting or standing for long periods of time can put stress on your back. Do not drive or use heavy machinery while taking prescription pain medicine. Use proper lifting techniques. When you bend and lift, use positions that put less stress on your back: [place] your knees. Keep the load close to your body. Avoid twisting. Exercise regularly as told by your health care provider. Exercising helps your back heal faster and helps prevent back injuries by keeping muscles strong and flexible. Work with a physical therapist to make a safe exercise program, as recommended by your health care provider. Do any exercises as told by your physical therapist. Lifestyle Maintain a healthy weight. Extra weight puts stress on your back and makes it difficult to have good  posture. Avoid activities or situations that make you feel anxious or stressed. Stress and anxiety increase muscle tension and can make back pain worse. Learn ways to manage anxiety and stress, such as through exercise. General instructions Sleep on a firm mattress in a comfortable position. Try lying on your side with your knees slightly bent. If you lie on your back, put a pillow under your knees. Keep your head and neck in a straight line with your spine (neutral position) when using electronic equipment like  smartphones or pads. To do this: Raise your smartphone or pad to look at it instead of bending your head or neck to look down. Put the smartphone or pad at the level of your face while looking at the screen. Follow your treatment plan as told by your health care provider. This may include: Cognitive or behavioral therapy. Acupuncture or massage therapy. Meditation or yoga. Contact a health care provider if: You have pain that is not relieved with rest or medicine. You have increasing pain going down into your legs or buttocks. Your pain does not improve after 2 weeks. You have pain at night. You lose weight without trying. You have a fever or chills. You develop nausea or vomiting. You develop abdominal pain. Get help right away if: You develop new bowel or bladder control problems. You have unusual weakness or numbness in your arms or legs. You feel faint. These symptoms may represent a serious problem that is an emergency. Do not wait to see if the symptoms will go away. Get medical help right away. Call your local emergency services (911 in the [place] Do not drive yourself to the hospital. Summary Acute back pain is sudden and usually short-lived. Use proper lifting techniques. When you bend and lift, use positions that put less stress on your back. Take over-the-counter and prescription medicines only as told by your health care provider, and apply heat or ice as told. This information is not intended to replace advice given to you by your health care provider. Make sure you discuss any questions you have with your health care provider. Document Revised: 10/22/2020 Document Reviewed: 10/22/2020 Elsevier Patient Education  2023 [employer]  

## 2022-08-25 NOTE — Progress Notes (Signed)
Subjective:    Patient ID: Hannah Ray, female    DOB: [birth year], 47 y.o.   MRN: [id number]   Chief Complaint: Back Pain and Neck Pain (Had adjustment from chiropractor on Tuesday and is in worse pain since)   Patient was seen around christmas for the pain and asthma flare up. It was thought that the pain was coming from asthma flare up. No better. She denies injury  Back Pain This is a new problem. The current episode started 1 to 4 weeks ago. The problem occurs intermittently. The problem has been gradually worsening since onset. The pain is present in the thoracic spine. The pain does not radiate. The pain is at a severity of 9/10. The pain is severe. The pain is The same all the time. The symptoms are aggravated by twisting, sitting, lying down and bending. Stiffness is present All day. Pertinent negatives include no abdominal pain, chest pain, headaches, numbness, paresis, paresthesias or weakness. The treatment provided no relief.       Review of Systems  Constitutional:  Negative for diaphoresis.  Eyes:  Negative for pain.  Respiratory:  Negative for shortness of breath.   Cardiovascular:  Negative for chest pain, palpitations and leg swelling.  Gastrointestinal:  Negative for abdominal pain.  Endocrine: Negative for polydipsia.  Musculoskeletal:  Positive for back pain.  Skin:  Negative for rash.  Neurological:  Negative for dizziness, weakness, numbness, headaches and paresthesias.  Hematological:  Does not bruise/bleed easily.  All other systems reviewed and are negative.      Objective:   Physical Exam Constitutional:      Appearance: Normal appearance. She is obese.  Cardiovascular:     Rate and Rhythm: Normal rate and regular rhythm.     Heart sounds: Normal heart sounds.  Pulmonary:     Effort: Pulmonary effort is normal.     Breath sounds: Normal breath sounds.  Musculoskeletal:     Comments: Rises slowly from sitting to standing Point tenderness  midthoracic area Limited ROM due to pain on flexion , extension and rotation (-) SLR bil  Skin:    General: Skin is warm.  Neurological:     General: No focal deficit present.     Mental Status: She is alert and oriented to person, place, and time.  Psychiatric:        Mood and Affect: Mood normal.        Behavior: Behavior normal.    BP 124/84   Pulse 75   Temp 98.2 F (36.8 C) (Temporal)   Resp 20   Ht 5\' 7"  (1.702 m)   Wt 270 lb (122.5 kg)   SpO2 95%   BMI 42.29 kg/m         Assessment & Plan:   Hannah Ray in today with chief complaint of Back Pain and Neck Pain (Had adjustment from chiropractor on Tuesday and is in worse pain since)   1. Acute midline thoracic back pain Moist heat Rest RTO if not improving Use TENS unit 3x a day - ketorolac (TORADOL) injection 60 mg - methylPREDNISolone acetate (DEPO-MEDROL) injection 80 mg - predniSONE (DELTASONE) 20 MG tablet; 2 po at sametime daily for 5 days-  Dispense: 10 tablet; Refill: 0    The above assessment and management plan was discussed with the patient. The patient verbalized understanding of and has agreed to the management plan. Patient is aware to call the clinic if symptoms persist or worsen. Patient is aware when to  return to the clinic for a follow-up visit. Patient educated on when it is appropriate to go to the emergency department.   Mary-Margaret Hassell Done, FNP

## 2022-08-28 ENCOUNTER — Ambulatory Visit (INDEPENDENT_AMBULATORY_CARE_PROVIDER_SITE_OTHER): Payer: BC Managed Care – PPO

## 2022-08-28 ENCOUNTER — Ambulatory Visit
Admission: RE | Admit: 2022-08-28 | Discharge: 2022-08-28 | Disposition: A | Discharge status: Home / Self Care | Payer: BC Managed Care – PPO | Source: Ambulatory Visit | Attending: Nurse Practitioner | Admitting: Nurse Practitioner

## 2022-08-28 VITALS — BP 154/92 | HR 89 | Temp 99.3°F | Resp 18

## 2022-08-28 DIAGNOSIS — M546 Pain in thoracic spine: Secondary | ICD-10-CM

## 2022-08-28 MED ORDER — CYCLOBENZAPRINE HCL 10 MG PO TABS: 10.0000 mg | ORAL_TABLET | Freq: Two times a day (BID) | ORAL | 0 refills | Status: AC | PRN

## 2022-08-28 MED ORDER — ACETAMINOPHEN ER 650 MG PO TBCR: 650.0000 mg | EXTENDED_RELEASE_TABLET | Freq: Three times a day (TID) | ORAL | 0 refills | Status: AC | PRN

## 2022-08-28 NOTE — ED Provider Notes (Signed)
[facility]    CSN: [id number] Arrival date & time: 08/28/22  1103      History   Chief Complaint Chief Complaint  Patient presents with   Back Pain    I believe chiropractor broke rib or vertebrae or possibly dislocated something. Saw him Tuesday 08/22/22. Have been to PCP but they did not xray, at the time I thought maybe slipped disc. Pain is not decreasing, movement difficult. Given prednisone - Entered by patient    HPI Hannah Ray is a 47 y.o. female.   The history is provided by the patient.   Patient presents for thoracic spine pain has been present over the last several weeks.  Patient's symptoms initially started around Christmas day.  She has since seen her PCP, local urgent care, and a chiropractor.  Patient states that patient has been treated with prednisone and tramadol.  Patient states over the last several days, and after seeing her chiropractor, her thoracic pain has worsened.  She states pain worsens when she coughs and deep breathes and with sudden movement.  Patient states that she has some numbness and tingling in her hands, but this is baseline for her.  She states she cannot differentiate of whether or not this symptom has worsened.  She complains of pain with coughing and deep breathing.  She denies fever, chills, injury or trauma.  Continues to experience some shortness of breath.  She reports that the last urgent care she went to did not have the ability to perform imaging for her.  She states that is when they gave her tramadol, which did not help her symptoms.  Patient is concerned that her vertebrae or ribs were fractured when she saw the chiropractor.  Past Medical History:  Diagnosis Date   Asthma    BMI 35.0-35.9,adult 12/28/2021   Bulging of cervical intervertebral disc 04/10/2022   Complaints of memory disturbance 02/24/2022   Essential (primary) hypertension 08/18/2021   IBS (irritable bowel syndrome)    Inguinal pain, right 12/28/2021    Lacunar infarction    left cerebellar hemisphere   Migraines    PONV (postoperative nausea and vomiting)    Scope patch helps   Situational anxiety 08/18/2021   Vitamin D deficiency 08/18/2021    Patient Active Problem List   Diagnosis Date Noted   Lacunar infarction 05/19/2022   Bulging of cervical intervertebral disc 04/10/2022   Subjective cognitive impairment 02/24/2022   BMI 35.0-35.9,adult 12/28/2021   Inguinal pain, right 12/28/2021   Situational anxiety 08/18/2021   Asthma 08/18/2021   Essential (primary) hypertension 08/18/2021   Vitamin D deficiency 08/18/2021    Past Surgical History:  Procedure Laterality Date   CESAREAN SECTION  2002   w/ tubal ligation   CHOLECYSTECTOMY  1997   HERNIA REPAIR  2004   INGUINAL HERNIA REPAIR Bilateral 02/24/2022   Procedure: LAPAROSCOPIC ABDOMINAL LYSIS OF ADHESIONS, RIGHT TRIPLE NEURECTOMY, RIGHT FEMORAL L HERNIA REPAIR, LEFT INGUINAL HERNIA  REPAIR WITH MESH TAP BLOCK;  Surgeon: Michael Boston, MD;  Location: [place];  Service: General;  Laterality: Bilateral;   LAPAROSCOPY N/A 02/24/2022   Procedure: LAPAROSCOPY DIAGNOSTIC;  Surgeon: Michael Boston, MD;  Location: [place];  Service: General;  Laterality: N/A;   TUBAL LIGATION      OB History   No obstetric history on file.      Home Medications    Prior to Admission medications   Medication Sig Start Date End Date Taking? Authorizing Provider  acetaminophen (TYLENOL 8 HOUR) 650 MG CR tablet Take 1 tablet (650 mg total) by mouth every 8 (eight) hours as needed for pain. 08/28/22  Yes Reveca Desmarais-Warren, Sadie Haber, NP  cyclobenzaprine (FLEXERIL) 10 MG tablet Take 1 tablet (10 mg total) by mouth 2 (two) times daily as needed for muscle spasms. 08/28/22  Yes Cainen Burnham-Warren, Sadie Haber, NP  albuterol (VENTOLIN HFA) 108 (90 Base) MCG/ACT inhaler Inhale into the lungs every 6 (six) hours as needed for wheezing or shortness of breath.    [provider]  Cholecalciferol (VITAMIN D-1000 MAX ST) 25 MCG (1000 UT) tablet Take by mouth.    [provider]  gabapentin (NEURONTIN) 100 MG capsule Take 100 mg by mouth 3 (three) times daily. 03/21/22   [provider]  hydrochlorothiazide (HYDRODIURIL) 25 MG tablet Take 25 mg by mouth daily.    [provider]  MYRBETRIQ 25 MG TB24 tablet Take 25 mg by mouth daily. 05/26/22   [provider]  predniSONE (DELTASONE) 20 MG tablet 2 po at sametime daily for 5 days- 08/25/22   Daphine Deutscher, Mary-Margaret, FNP  valsartan (DIOVAN) 40 MG tablet Take 1 tablet (40 mg total) by mouth daily. 05/09/22   Rakes, Doralee Albino, FNP    Family History Family History  Problem Relation Age of Onset   Depression Mother    Alcoholism Mother    Drug abuse Mother    Dementia Paternal Grandfather    Stroke Paternal Grandfather    Dementia Paternal Aunt    Seizures Cousin     Social History Social History   Tobacco Use   Smoking status: Never   Smokeless tobacco: Never  Vaping Use   Vaping Use: Every day   Substances: Nicotine  Substance Use Topics   Alcohol use: Never   Drug use: Never     Allergies   Lisinopril, Mobic [meloxicam], Norvasc [amlodipine], Tamiflu [oseltamivir], Tape, Tetracycline, and Trazodone   Review of Systems Review of Systems Per HPI  Physical Exam Triage Vital Signs ED Triage Vitals  Enc Vitals Group     BP 08/28/22 1147 (!) 154/92     Pulse Rate 08/28/22 1147 89     Resp 08/28/22 1147 18     Temp 08/28/22 1147 99.3 F (37.4 C)     Temp Source 08/28/22 1147 Oral     SpO2 08/28/22 1147 97 %     Weight --      Height --      Head Circumference --      Peak Flow --      Pain Score 08/28/22 1146 7     Pain Loc --      Pain Edu? --      Excl. in GC? --    No data found.  Updated Vital Signs BP (!) 154/92 (BP Location: Right Arm)   Pulse 89   Temp 99.3 F (37.4 C) (Oral)   Resp 18   LMP 08/09/2022 (Exact Date)   SpO2 97%    Visual Acuity Right Eye Distance:   Left Eye Distance:   Bilateral Distance:    Right Eye Near:   Left Eye Near:    Bilateral Near:     Physical Exam Vitals and nursing note reviewed.  Constitutional:      General: She is not in acute distress.    Appearance: Normal appearance.  HENT:     Head: Normocephalic.     Mouth/Throat:     Mouth: Mucous membranes are moist.  Eyes:     Extraocular Movements: Extraocular movements intact.     Pupils: Pupils are equal, round, and reactive to light.  Cardiovascular:     Rate and Rhythm: Normal rate and regular rhythm.     Pulses: Normal pulses.     Heart sounds: Normal heart sounds.  Pulmonary:     Effort: Pulmonary effort is normal. No respiratory distress.     Breath sounds: No stridor. No wheezing, rhonchi or rales.  Abdominal:     General: Bowel sounds are normal.     Palpations: Abdomen is soft.     Tenderness: There is no abdominal tenderness.  Musculoskeletal:     Cervical back: Normal range of motion.     Thoracic back: Tenderness present. No swelling, deformity or spasms. Decreased range of motion.     Comments: Tenderness noted to T10-T12  Lymphadenopathy:     Cervical: No cervical adenopathy.  Skin:    General: Skin is warm and dry.  Neurological:     General: No focal deficit present.     Mental Status: She is alert and oriented to person, place, and time.  Psychiatric:        Mood and Affect: Mood normal.        Behavior: Behavior normal.      UC Treatments / Results  Labs (all labs ordered are listed, but only abnormal results are displayed) Labs Reviewed - No data to display  EKG   Radiology DG Thoracic Spine 2 View  Result Date: 08/28/2022 CLINICAL DATA:  Thoracic spine pain EXAM: THORACIC SPINE 2 VIEWS COMPARISON:  Radiograph 08/11/2022 FINDINGS: There is no evidence of thoracic spine fracture. There are mild multilevel degenerative changes. Minimal levoconvex curvature at the thoracolumbar  junction. Right upper quadrant surgical clips noted. IMPRESSION: No evidence of thoracic spine fracture. Mild multilevel degenerative changes. Electronically Signed   By: Maurine Simmering M.D.   On: 08/28/2022 12:20    Procedures Procedures (including critical care time)  Medications Ordered in UC Medications - No data to display  Initial Impression / Assessment and Plan / UC Course  I have reviewed the triage vital signs and the nursing notes.  Pertinent labs & imaging results that were available during my care of the patient were reviewed by me and considered in my medical decision making (see chart for details).  The patient is well-appearing, she is in no acute distress, she is hypertensive, but vital signs are otherwise stable.  X-rays of the thoracic spine are negative, they do show multilevel degenerative changes, but no acute fractures or dislocation.  There are also no obvious rib fractures noted on the x-ray.  Difficult to ascertain the cause of the patient's thoracic spine pain.  Will start patient on Flexeril 10 mg as a muscle relaxant and Tylenol 650 mg tablet to help with pain.  Supportive care recommendations were provided to the patient to include the use of ice, heat, and gentle stretching and range of motion exercises.  Patient was advised that if symptoms do not improve over the next 1 to 2 weeks, recommend that she follow-up orthopedics for further evaluation.  Patient was given formation for [facility] and for [facility].  Patient verbalizes understanding.  All questions were answered.  Patient stable for discharge.   Final Clinical Impressions(s) / UC Diagnoses   Final diagnoses:  Thoracic spine pain     Discharge Instructions      The X-ray is negative for a vertebral fracture or rib  fracture. Take medication as prescribed. Continue the use of ice and heat.  Apply ice for pain or swelling, heat for spasm or stiffness.  Apply for 20 minutes, remove for 1  hour, then repeat is much as possible. Gentle stretching and range of motion exercises while symptoms persist. If you continue to feel short of breath or have difficulty breathing, recommend that you follow-up in the emergency department for further evaluation. As discussed, if symptoms do not improve after the next 5 to 7 days, please consider following up with orthopedics for further evaluation of your spine.  You can follow-up with [facility] at [phone] or with [facility] at [phone]. Follow-up as needed.     ED Prescriptions     Medication Sig Dispense Auth. Provider   cyclobenzaprine (FLEXERIL) 10 MG tablet Take 1 tablet (10 mg total) by mouth 2 (two) times daily as needed for muscle spasms. 20 tablet Solomiya Pascale-Warren, Alda Lea, NP   acetaminophen (TYLENOL 8 HOUR) 650 MG CR tablet Take 1 tablet (650 mg total) by mouth every 8 (eight) hours as needed for pain. 30 tablet Adeola Dennen-Warren, Alda Lea, NP      PDMP not reviewed this encounter.   Tish Men, NP 08/28/22 [phone]

## 2022-08-28 NOTE — ED Triage Notes (Signed)
Pt reports back pain since 08/07/22. Pt was saw by her PCP on 08/11/22.   Pt went to chiropractor on 08/21/22 and was told she has bulge discs in neck. Pt think she may have  broken ribs or vertebrae or possibly dislocated something after Chiropractor visit on 08/22/22. Pain is not going away even with the 4 Tramadol give by PCP. Pain is worse when moving. States when she moves she can't breath due to back pain.

## 2022-08-28 NOTE — Discharge Instructions (Addendum)
The X-ray is negative for a vertebral fracture or rib fracture. Take medication as prescribed. Continue the use of ice and heat.  Apply ice for pain or swelling, heat for spasm or stiffness.  Apply for 20 minutes, remove for 1 hour, then repeat is much as possible. Gentle stretching and range of motion exercises while symptoms persist. If you continue to feel short of breath or have difficulty breathing, recommend that you follow-up in the emergency department for further evaluation. As discussed, if symptoms do not improve after the next 5 to 7 days, please consider following up with orthopedics for further evaluation of your spine.  You can follow-up with [facility] at [phone] or with [facility] at [phone]. Follow-up as needed.

## 2022-09-01 ENCOUNTER — Encounter: Payer: Self-pay | Admitting: Family Medicine

## 2022-09-04 DIAGNOSIS — R2681 Unsteadiness on feet: Secondary | ICD-10-CM | POA: Diagnosis not present

## 2022-09-04 DIAGNOSIS — R202 Paresthesia of skin: Secondary | ICD-10-CM | POA: Diagnosis not present

## 2022-09-04 DIAGNOSIS — I10 Essential (primary) hypertension: Secondary | ICD-10-CM | POA: Diagnosis not present

## 2022-09-04 DIAGNOSIS — Z87891 Personal history of nicotine dependence: Secondary | ICD-10-CM | POA: Diagnosis not present

## 2022-09-04 DIAGNOSIS — Z888 Allergy status to other drugs, medicaments and biological substances status: Secondary | ICD-10-CM | POA: Diagnosis not present

## 2022-09-04 DIAGNOSIS — J45909 Unspecified asthma, uncomplicated: Secondary | ICD-10-CM | POA: Diagnosis not present

## 2022-09-04 DIAGNOSIS — G459 Transient cerebral ischemic attack, unspecified: Secondary | ICD-10-CM | POA: Diagnosis not present

## 2022-09-04 DIAGNOSIS — Z79899 Other long term (current) drug therapy: Secondary | ICD-10-CM | POA: Diagnosis not present

## 2022-09-04 DIAGNOSIS — G3184 Mild cognitive impairment, so stated: Secondary | ICD-10-CM | POA: Diagnosis not present

## 2022-09-04 DIAGNOSIS — E041 Nontoxic single thyroid nodule: Secondary | ICD-10-CM | POA: Diagnosis not present

## 2022-09-04 DIAGNOSIS — R2689 Other abnormalities of gait and mobility: Secondary | ICD-10-CM | POA: Diagnosis not present

## 2022-09-04 DIAGNOSIS — Z881 Allergy status to other antibiotic agents status: Secondary | ICD-10-CM | POA: Diagnosis not present

## 2022-09-04 DIAGNOSIS — H5330 Unspecified disorder of binocular vision: Secondary | ICD-10-CM | POA: Diagnosis not present

## 2022-09-04 DIAGNOSIS — E119 Type 2 diabetes mellitus without complications: Secondary | ICD-10-CM | POA: Diagnosis not present

## 2022-09-04 DIAGNOSIS — R29818 Other symptoms and signs involving the nervous system: Secondary | ICD-10-CM | POA: Diagnosis not present

## 2022-09-04 DIAGNOSIS — Z6841 Body Mass Index (BMI) 40.0 and over, adult: Secondary | ICD-10-CM | POA: Diagnosis not present

## 2022-09-04 DIAGNOSIS — H538 Other visual disturbances: Secondary | ICD-10-CM | POA: Diagnosis not present

## 2022-09-04 DIAGNOSIS — R200 Anesthesia of skin: Secondary | ICD-10-CM | POA: Diagnosis not present

## 2022-09-05 DIAGNOSIS — G459 Transient cerebral ischemic attack, unspecified: Secondary | ICD-10-CM | POA: Diagnosis not present

## 2022-09-05 DIAGNOSIS — I10 Essential (primary) hypertension: Secondary | ICD-10-CM | POA: Diagnosis not present

## 2022-09-05 DIAGNOSIS — E119 Type 2 diabetes mellitus without complications: Secondary | ICD-10-CM | POA: Diagnosis not present

## 2022-09-08 ENCOUNTER — Ambulatory Visit: Payer: BC Managed Care – PPO | Admitting: Family Medicine

## 2022-09-13 DIAGNOSIS — G459 Transient cerebral ischemic attack, unspecified: Secondary | ICD-10-CM | POA: Diagnosis not present

## 2022-09-13 DIAGNOSIS — E041 Nontoxic single thyroid nodule: Secondary | ICD-10-CM | POA: Diagnosis not present

## 2022-09-13 DIAGNOSIS — I10 Essential (primary) hypertension: Secondary | ICD-10-CM | POA: Diagnosis not present

## 2022-09-13 DIAGNOSIS — Z1330 Encounter for screening examination for mental health and behavioral disorders, unspecified: Secondary | ICD-10-CM | POA: Diagnosis not present

## 2022-09-13 DIAGNOSIS — E119 Type 2 diabetes mellitus without complications: Secondary | ICD-10-CM | POA: Diagnosis not present

## 2022-09-15 ENCOUNTER — Ambulatory Visit: Payer: BC Managed Care – PPO | Admitting: Family Medicine

## 2022-09-15 DIAGNOSIS — S29019A Strain of muscle and tendon of unspecified wall of thorax, initial encounter: Secondary | ICD-10-CM | POA: Diagnosis not present

## 2022-09-15 DIAGNOSIS — I10 Essential (primary) hypertension: Secondary | ICD-10-CM | POA: Diagnosis not present

## 2022-09-15 DIAGNOSIS — M546 Pain in thoracic spine: Secondary | ICD-10-CM | POA: Diagnosis not present

## 2022-09-16 DIAGNOSIS — R92323 Mammographic fibroglandular density, bilateral breasts: Secondary | ICD-10-CM | POA: Diagnosis not present

## 2022-09-16 DIAGNOSIS — Z1231 Encounter for screening mammogram for malignant neoplasm of breast: Secondary | ICD-10-CM | POA: Diagnosis not present

## 2022-09-16 LAB — HM MAMMOGRAPHY

## 2022-09-18 DIAGNOSIS — L820 Inflamed seborrheic keratosis: Secondary | ICD-10-CM | POA: Diagnosis not present

## 2022-09-18 DIAGNOSIS — L814 Other melanin hyperpigmentation: Secondary | ICD-10-CM | POA: Diagnosis not present

## 2022-09-18 DIAGNOSIS — L570 Actinic keratosis: Secondary | ICD-10-CM | POA: Diagnosis not present

## 2022-09-18 DIAGNOSIS — D225 Melanocytic nevi of trunk: Secondary | ICD-10-CM | POA: Diagnosis not present

## 2022-09-18 DIAGNOSIS — D485 Neoplasm of uncertain behavior of skin: Secondary | ICD-10-CM | POA: Diagnosis not present

## 2022-09-19 DIAGNOSIS — R200 Anesthesia of skin: Secondary | ICD-10-CM | POA: Diagnosis not present

## 2022-09-19 DIAGNOSIS — J45909 Unspecified asthma, uncomplicated: Secondary | ICD-10-CM | POA: Diagnosis not present

## 2022-09-19 DIAGNOSIS — Z1330 Encounter for screening examination for mental health and behavioral disorders, unspecified: Secondary | ICD-10-CM | POA: Diagnosis not present

## 2022-09-19 DIAGNOSIS — G459 Transient cerebral ischemic attack, unspecified: Secondary | ICD-10-CM | POA: Diagnosis not present

## 2022-09-19 DIAGNOSIS — I10 Essential (primary) hypertension: Secondary | ICD-10-CM | POA: Diagnosis not present

## 2022-09-19 DIAGNOSIS — R413 Other amnesia: Secondary | ICD-10-CM | POA: Diagnosis not present

## 2022-09-20 ENCOUNTER — Ambulatory Visit: Payer: BC Managed Care – PPO | Attending: Nurse Practitioner

## 2022-09-20 ENCOUNTER — Other Ambulatory Visit: Payer: Self-pay

## 2022-09-20 DIAGNOSIS — M6281 Muscle weakness (generalized): Secondary | ICD-10-CM | POA: Diagnosis not present

## 2022-09-20 DIAGNOSIS — M546 Pain in thoracic spine: Secondary | ICD-10-CM | POA: Insufficient documentation

## 2022-09-20 DIAGNOSIS — R252 Cramp and spasm: Secondary | ICD-10-CM | POA: Insufficient documentation

## 2022-09-20 NOTE — Patient Instructions (Signed)

## 2022-09-20 NOTE — Therapy (Signed)
OUTPATIENT PHYSICAL THERAPY THORACOLUMBAR EVALUATION   Patient Name: Hannah Ray MRN: [id number] DOB:30-Jun-1976, 47 y.o., female Today's Date: 09/20/2022  END OF SESSION:  PT End of Session - 09/20/22 1014     Visit Number 1    Date for PT Re-Evaluation 11/15/22    Authorization Type [employer]    PT Start Time 0932    PT Stop Time 1010    PT Time Calculation (min) 38 min    Activity Tolerance Patient tolerated treatment well    Behavior During Therapy [facility] for tasks assessed/performed             Past Medical History:  Diagnosis Date   Asthma    BMI 35.0-35.9,adult 12/28/2021   Bulging of cervical intervertebral disc 04/10/2022   Complaints of memory disturbance 02/24/2022   Essential (primary) hypertension 08/18/2021   IBS (irritable bowel syndrome)    Inguinal pain, right 12/28/2021   Lacunar infarction    left cerebellar hemisphere   Migraines    PONV (postoperative nausea and vomiting)    Scope patch helps   Situational anxiety 08/18/2021   Vitamin D deficiency 08/18/2021   Past Surgical History:  Procedure Laterality Date   CESAREAN SECTION  2002   w/ tubal ligation   CHOLECYSTECTOMY  1997   HERNIA REPAIR  2004   INGUINAL HERNIA REPAIR Bilateral 02/24/2022   Procedure: LAPAROSCOPIC ABDOMINAL LYSIS OF ADHESIONS, RIGHT TRIPLE NEURECTOMY, RIGHT FEMORAL L HERNIA REPAIR, LEFT INGUINAL HERNIA  REPAIR WITH MESH TAP BLOCK;  Surgeon: Michael Boston, MD;  Location: [place];  Service: General;  Laterality: Bilateral;   LAPAROSCOPY N/A 02/24/2022   Procedure: LAPAROSCOPY DIAGNOSTIC;  Surgeon: Michael Boston, MD;  Location: [place];  Service: General;  Laterality: N/A;   TUBAL LIGATION     Patient Active Problem List   Diagnosis Date Noted   Lacunar infarction 05/19/2022   Bulging of cervical intervertebral disc 04/10/2022   Subjective cognitive impairment 02/24/2022   BMI 35.0-35.9,adult 12/28/2021   Inguinal pain, right 12/28/2021    Situational anxiety 08/18/2021   Asthma 08/18/2021   Essential (primary) hypertension 08/18/2021   Vitamin D deficiency 08/18/2021    PCP: Baruch Gouty, FNP   REFERRING PROVIDER: Burnard Hawthorne, NP   REFERRING DIAG:  Diagnosis  M54.6 (ICD-10-CM) - Pain in thoracic spine    Rationale for Evaluation and Treatment: Rehabilitation  THERAPY DIAG:  Muscle weakness (generalized) - Plan: PT plan of care cert/re-cert  Pain in thoracic spine - Plan: PT plan of care cert/re-cert  Cramp and spasm - Plan: PT plan of care cert/re-cert  ONSET DATE: 32/44/01  SUBJECTIVE:  SUBJECTIVE STATEMENT: Pt presents to PT with thoracic pain that she reports began on 08/05/22 after a chiropractic adjustment.   Pt had a TIA 09/04/22.   PERTINENT HISTORY:  CVA 2023, TIA 09/04/22  PAIN:  Are you having pain? Yes: NPRS scale: 4/10, up to 10/10 briefly that feels like electricity  Pain location: Lt thoracic  Pain description: deep aching, sometimes electricity Aggravating factors: moving, breathing too hard, hitting a bump in the car Relieving factors: nothing  PRECAUTIONS: None  WEIGHT BEARING RESTRICTIONS: No  FALLS:  Has patient fallen in last 6 months? No  LIVING ENVIRONMENT: Lives with: lives with their family Lives in: House/apartment   OCCUPATION: no- lost job after CVA  PLOF: Independent, and Leisure: walking and hiking  PATIENT GOALS: reduce Lt thoracic pain, do housework without limitation  NEXT MD VISIT: May/June 2024  OBJECTIVE:   DIAGNOSTIC FINDINGS:  X-ray: 08/28/22 No evidence of thoracic spine fracture.  Mild multilevel degenerative changes.  PATIENT SURVEYS:  09/20/22:FOTO 37 (goal is 52)  SCREENING FOR RED FLAGS: Bowel or bladder incontinence: No Spinal tumors:  No Cauda equina syndrome: No Compression fracture: No Abdominal aneurysm: No  COGNITION: Overall cognitive status: History of cognitive impairments - at baseline Some memory deficits after CVA in 2023.     SENSATION: WFL   POSTURE: rounded shoulders, forward head, and increased thoracic kyphosis  PALPATION: Reduced thoracic mobility with PA mobs T4-8.  Tension in Lt thoracic paraspinals with pain.    Cervical A/ROM:  Full without pain  LOWER EXTREMITY ROM:    UE A/ROM: full with pain on the Lt  LOWER EXTREMITY MMT:   Lt UE 4+/5, Rt 5/5.  Lt UE weakness residual after CVA  GAIT: Distance walked: 50 Assistive device utilized: None Level of assistance: Complete Independence Comments: symmetry  TODAY'S TREATMENT:                                                                                                                              DATE: 09/20/22  HEP established-see below  PATIENT EDUCATION:  Education details: Access Code: [email or web address] Person educated: Patient Education method: Explanation, Demonstration, and Handouts Education comprehension: verbalized understanding and returned demonstration  HOME EXERCISE PROGRAM: Access Code: [email or web address] URL: [email or web address] Date: 09/20/2022 Prepared by: Stuart Thoracic Rotation with Knee on Foam Roll  - 2 x daily - 7 x weekly - 1 sets - 10 reps - Cat-Camel  - 2 x daily - 7 x weekly - 1 sets - 10 reps - 5 hold - Child's Pose Stretch  - 1 x daily - 7 x weekly - 3 sets - 10 reps - Child's Pose with Sidebending  - 1 x daily - 7 x weekly - 3 sets - 10 reps - Seated Correct Posture  - 1 x daily - 7 x weekly - 3 sets - 10 reps  ASSESSMENT:  CLINICAL IMPRESSION: Patient is a 47 y.o. female who  was seen today for physical therapy evaluation and treatment for thoracic pain Lt>Rt.  Pt reports that pain began after a visit to the chiropractor on 08/06/23.  Pt has had constant pain since  then and rates the pain as 4/10 and up to 10/10 brief periods of burning/shooting pain with movement.  Pt is limited with housework due to pain.  Pt with postural abnormalities, reduced thoracic mobility and tension/trigger points in Lt>Rt thoracic spine today.  Patient will benefit from skilled PT to address the below impairments and improve overall function.   OBJECTIVE IMPAIRMENTS: decreased activity tolerance, decreased ROM, decreased strength, hypomobility, increased muscle spasms, impaired flexibility, improper body mechanics, postural dysfunction, and pain.   ACTIVITY LIMITATIONS: carrying and lifting  PARTICIPATION LIMITATIONS: meal prep, cleaning, laundry, and driving  PERSONAL FACTORS: 1 comorbidity: CVA/TIA  are also affecting patient's functional outcome.   REHAB POTENTIAL: Good  CLINICAL DECISION MAKING: Stable/uncomplicated  EVALUATION COMPLEXITY: Low   GOALS: Goals reviewed with patient? Yes  SHORT TERM GOALS: Target date: 10/18/2022    Be independent in initial HEP Baseline: Goal status: INITIAL  2.  Report > or = to 30% reduction in thoracic pain with daily tasks and cleaning  Baseline:  Goal status: INITIAL  3.  Verbalize and demonstrate postural corrections with sitting and daily tasks  Baseline:  Goal status: INITIAL   LONG TERM GOALS: Target date: 11/15/2022    Be independent in advanced HEP Baseline:  Goal status: INITIAL  2.  Report > or = to 70% reduction in thoracic pain with daily tasks and cleaning  Baseline:  4-10/10 Goal status: INITIAL  3.  Improve FOTO to > or = to 62  Baseline: 37 Goal status: INITIAL  4.  Perform housework without limitation due to thoracic pain Baseline:  Goal status: INITIAL   PLAN:  PT FREQUENCY: 1-2x/week  PT DURATION: 8 weeks  PLANNED INTERVENTIONS: Therapeutic exercises, Therapeutic activity, Neuromuscular re-education, Balance training, Gait training, Patient/Family education, Self Care, Joint  mobilization, Joint manipulation, Aquatic Therapy, Dry Needling, Electrical stimulation, Cryotherapy, Moist heat, Taping, Traction, Manual therapy, and Re-evaluation.  PLAN FOR NEXT SESSION: DN to thoracic, thoracic mobility, postural strength.   Sigurd Sos, PT 09/20/22 10:35 AM   [facility] [address] [place], [place] [address] Phone # [phone] Fax [phone]

## 2022-09-25 ENCOUNTER — Encounter: Payer: Self-pay | Admitting: Rehabilitative and Restorative Service Providers"

## 2022-09-25 ENCOUNTER — Ambulatory Visit: Payer: BC Managed Care – PPO | Admitting: Rehabilitative and Restorative Service Providers"

## 2022-09-25 DIAGNOSIS — R252 Cramp and spasm: Secondary | ICD-10-CM | POA: Diagnosis not present

## 2022-09-25 DIAGNOSIS — M546 Pain in thoracic spine: Secondary | ICD-10-CM | POA: Diagnosis not present

## 2022-09-25 DIAGNOSIS — M6281 Muscle weakness (generalized): Secondary | ICD-10-CM | POA: Diagnosis not present

## 2022-09-25 NOTE — Patient Instructions (Signed)

## 2022-09-25 NOTE — Therapy (Signed)
OUTPATIENT PHYSICAL THERAPY TREATMENT NOTE   Patient Name: Hannah Ray MRN: [id number] DOB:[birth year], 47 y.o., female Today's Date: 09/25/2022  END OF SESSION:  PT End of Session - 09/25/22 0733     Visit Number 2    Date for PT Re-Evaluation 11/15/22    Authorization Type BCBS    PT Start Time 0730    PT Stop Time 0800    PT Time Calculation (min) 30 min    Activity Tolerance Patient tolerated treatment well    Behavior During Therapy [facility] for tasks assessed/performed             Past Medical History:  Diagnosis Date   Asthma    BMI 35.0-35.9,adult 12/28/2021   Bulging of cervical intervertebral disc 04/10/2022   Complaints of memory disturbance 02/24/2022   Essential (primary) hypertension 08/18/2021   IBS (irritable bowel syndrome)    Inguinal pain, right 12/28/2021   Lacunar infarction    left cerebellar hemisphere   Migraines    PONV (postoperative nausea and vomiting)    Scope patch helps   Situational anxiety 08/18/2021   Vitamin D deficiency 08/18/2021   Past Surgical History:  Procedure Laterality Date   CESAREAN SECTION  2002   w/ tubal ligation   CHOLECYSTECTOMY  1997   HERNIA REPAIR  2004   INGUINAL HERNIA REPAIR Bilateral 02/24/2022   Procedure: LAPAROSCOPIC ABDOMINAL LYSIS OF ADHESIONS, RIGHT TRIPLE NEURECTOMY, RIGHT FEMORAL L HERNIA REPAIR, LEFT INGUINAL HERNIA  REPAIR WITH MESH TAP BLOCK;  Surgeon: Michael Boston, MD;  Location: [place];  Service: General;  Laterality: Bilateral;   LAPAROSCOPY N/A 02/24/2022   Procedure: LAPAROSCOPY DIAGNOSTIC;  Surgeon: Michael Boston, MD;  Location: [place];  Service: General;  Laterality: N/A;   TUBAL LIGATION     Patient Active Problem List   Diagnosis Date Noted   Lacunar infarction 05/19/2022   Bulging of cervical intervertebral disc 04/10/2022   Subjective cognitive impairment 02/24/2022   BMI 35.0-35.9,adult 12/28/2021   Inguinal pain, right 12/28/2021    Situational anxiety 08/18/2021   Asthma 08/18/2021   Essential (primary) hypertension 08/18/2021   Vitamin D deficiency 08/18/2021    PCP: Baruch Gouty, FNP   REFERRING PROVIDER: Burnard Hawthorne, NP  REFERRING DIAG:  Diagnosis  M54.6 (ICD-10-CM) - Pain in thoracic spine    Rationale for Evaluation and Treatment: Rehabilitation  THERAPY DIAG:  Muscle weakness (generalized)  Pain in thoracic spine  Cramp and spasm  ONSET DATE: 08/05/22  SUBJECTIVE:  SUBJECTIVE STATEMENT: Pt states that she has been trying to do her HEP, but she has pain that limits her.  PERTINENT HISTORY:  CVA 2023, TIA 09/04/22  PAIN:  Are you having pain? Yes: NPRS scale: 6/10 Pain location: Lt thoracic  Pain description: deep aching, sometimes electricity Aggravating factors: moving, breathing too hard, hitting a bump in the car Relieving factors: nothing  PRECAUTIONS: None  WEIGHT BEARING RESTRICTIONS: No  FALLS:  Has patient fallen in last 6 months? No  LIVING ENVIRONMENT: Lives with: lives with their family Lives in: House/apartment   OCCUPATION: no- lost job after CVA  PLOF: Independent, and Leisure: walking and hiking  PATIENT GOALS: reduce Lt thoracic pain, do housework without limitation  NEXT MD VISIT: May/June 2024  OBJECTIVE:   DIAGNOSTIC FINDINGS:  X-ray: 08/28/22 No evidence of thoracic spine fracture.  Mild multilevel degenerative changes.  PATIENT SURVEYS:  09/20/22:FOTO 37 (goal is 102)  SCREENING FOR RED FLAGS: Bowel or bladder incontinence: No Spinal tumors: No Cauda equina syndrome: No Compression fracture: No Abdominal aneurysm: No  COGNITION: Overall cognitive status: History of cognitive impairments - at baseline Some memory deficits after CVA in  2023.     SENSATION: WFL   POSTURE: rounded shoulders, forward head, and increased thoracic kyphosis  PALPATION: Reduced thoracic mobility with PA mobs T4-8.  Tension in Lt thoracic paraspinals with pain.    Cervical A/ROM:  Full without pain  LOWER EXTREMITY ROM:    UE A/ROM: full with pain on the Lt  LOWER EXTREMITY MMT:   Lt UE 4+/5, Rt 5/5.  Lt UE weakness residual after CVA  GAIT: Distance walked: 50 Assistive device utilized: None Level of assistance: Complete Independence Comments: symmetry  TODAY'S TREATMENT:                                                                                                                               DATE: 09/25/2022  Nustep level 3 x5 min with PT present to discuss status Quadruped cat/cow 2x10 Childs Pose x20 seconds [name] with side bending x20 sec bilat Sidelying open book x10 lying on right side Seated open book x10 to right side (secondary to being unable to tolerate lying on left side Trigger Point Dry-Needling  Treatment instructions: Expect mild to moderate muscle soreness. S/S of pneumothorax if dry needled over a lung field, and to seek immediate medical attention should they occur. Patient verbalized understanding of these instructions and education. Patient Consent Given: Yes Education handout provided: Yes Muscles treated: bilateral thoracic multifidi, left rhomboids Electrical stimulation performed: No Parameters: N/A Treatment response/outcome: Utilized skilled palpation to locate/identify trigger points.  Able to illicit twitch response and muscle elongation Manual Therapy:  Performed myofascial glide using suction cup gliding to further promote tissue movement and elongation.  Followed with soft tissue mobilization to lumbar and thoracic spine and paraspinal musculature.    DATE: 09/20/22  HEP established-see below  PATIENT EDUCATION:  Education details:  Access Code: [email or web address] Person educated:  Patient Education method: Explanation, Demonstration, and Handouts Education comprehension: verbalized understanding and returned demonstration  HOME EXERCISE PROGRAM: Access Code: [email or web address] URL: [email or web address] Date: 09/20/2022 Prepared by: [place] Thoracic Rotation with Knee on Foam Roll  - 2 x daily - 7 x weekly - 1 sets - 10 reps - Cat-Camel  - 2 x daily - 7 x weekly - 1 sets - 10 reps - 5 hold - Child's Pose Stretch  - 1 x daily - 7 x weekly - 3 sets - 10 reps - Child's Pose with Sidebending  - 1 x daily - 7 x weekly - 3 sets - 10 reps - Seated Correct Posture  - 1 x daily - 7 x weekly - 3 sets - 10 reps  ASSESSMENT:  CLINICAL IMPRESSION: Ms Radosevich presents to skilled PT reporting difficulty with lying on her left side for open book exercise.  States that some of her exercises she has not been able to complete at home.  Reviewed HEP and provided alternative for open book in sitting, and patient was able to perform more easily.  Patient with some positive response following dry needling and manual therapy, but continues to have pain.  Patient verbalizes understanding that she can perform open book in sitting at home if she is still having difficulty performing left sidelying.  Patient continues to require skilled PT to progress towards goal related activities.  OBJECTIVE IMPAIRMENTS: decreased activity tolerance, decreased ROM, decreased strength, hypomobility, increased muscle spasms, impaired flexibility, improper body mechanics, postural dysfunction, and pain.   ACTIVITY LIMITATIONS: carrying and lifting  PARTICIPATION LIMITATIONS: meal prep, cleaning, laundry, and driving  PERSONAL FACTORS: 1 comorbidity: CVA/TIA  are also affecting patient's functional outcome.   REHAB POTENTIAL: Good  CLINICAL DECISION MAKING: Stable/uncomplicated  EVALUATION COMPLEXITY: Low   GOALS: Goals reviewed with patient? Yes  SHORT TERM GOALS:  Target date: 10/18/2022    Be independent in initial HEP Baseline: Goal status: IN PROGRESS  2.  Report > or = to 30% reduction in thoracic pain with daily tasks and cleaning  Baseline:  Goal status: INITIAL  3.  Verbalize and demonstrate postural corrections with sitting and daily tasks  Baseline:  Goal status: INITIAL   LONG TERM GOALS: Target date: 11/15/2022    Be independent in advanced HEP Baseline:  Goal status: INITIAL  2.  Report > or = to 70% reduction in thoracic pain with daily tasks and cleaning  Baseline:  4-10/10 Goal status: INITIAL  3.  Improve FOTO to > or = to 62  Baseline: 37 Goal status: INITIAL  4.  Perform housework without limitation due to thoracic pain Baseline:  Goal status: INITIAL   PLAN:  PT FREQUENCY: 1-2x/week  PT DURATION: 8 weeks  PLANNED INTERVENTIONS: Therapeutic exercises, Therapeutic activity, Neuromuscular re-education, Balance training, Gait training, Patient/Family education, Self Care, Joint mobilization, Joint manipulation, Aquatic Therapy, Dry Needling, Electrical stimulation, Cryotherapy, Moist heat, Taping, Traction, Manual therapy, and Re-evaluation.  PLAN FOR NEXT SESSION: DN to thoracic, thoracic mobility, postural strength.   Juel Burrow, PT 09/25/22 9:13 AM   [facility] [address] [place], [place] [address] Phone # [phone] Fax [phone]

## 2022-09-27 DIAGNOSIS — E119 Type 2 diabetes mellitus without complications: Secondary | ICD-10-CM | POA: Diagnosis not present

## 2022-10-03 ENCOUNTER — Ambulatory Visit: Payer: BC Managed Care – PPO | Admitting: Rehabilitative and Restorative Service Providers"

## 2022-10-03 ENCOUNTER — Encounter: Payer: Self-pay | Admitting: Family Medicine

## 2022-10-03 ENCOUNTER — Encounter: Payer: Self-pay | Admitting: Rehabilitative and Restorative Service Providers"

## 2022-10-03 DIAGNOSIS — M546 Pain in thoracic spine: Secondary | ICD-10-CM | POA: Diagnosis not present

## 2022-10-03 DIAGNOSIS — R252 Cramp and spasm: Secondary | ICD-10-CM | POA: Diagnosis not present

## 2022-10-03 DIAGNOSIS — M6281 Muscle weakness (generalized): Secondary | ICD-10-CM | POA: Diagnosis not present

## 2022-10-03 NOTE — Therapy (Signed)
OUTPATIENT PHYSICAL THERAPY TREATMENT NOTE   Patient Name: Hannah Ray MRN: [id number] DOB:27-Nov-1975, 47 y.o., female Today's Date: 10/03/2022  END OF SESSION:  PT End of Session - 10/03/22 0931     Visit Number 3    Date for PT Re-Evaluation 11/15/22    Authorization Type BCBS    PT Start Time 0930    PT Stop Time 1010    PT Time Calculation (min) 40 min    Activity Tolerance Patient tolerated treatment well    Behavior During Therapy [facility] for tasks assessed/performed             Past Medical History:  Diagnosis Date   Asthma    BMI 35.0-35.9,adult 12/28/2021   Bulging of cervical intervertebral disc 04/10/2022   Complaints of memory disturbance 02/24/2022   Essential (primary) hypertension 08/18/2021   IBS (irritable bowel syndrome)    Inguinal pain, right 12/28/2021   Lacunar infarction    left cerebellar hemisphere   Migraines    PONV (postoperative nausea and vomiting)    Scope patch helps   Situational anxiety 08/18/2021   Vitamin D deficiency 08/18/2021   Past Surgical History:  Procedure Laterality Date   CESAREAN SECTION  2002   w/ tubal ligation   CHOLECYSTECTOMY  1997   HERNIA REPAIR  2004   INGUINAL HERNIA REPAIR Bilateral 02/24/2022   Procedure: LAPAROSCOPIC ABDOMINAL LYSIS OF ADHESIONS, RIGHT TRIPLE NEURECTOMY, RIGHT FEMORAL L HERNIA REPAIR, LEFT INGUINAL HERNIA  REPAIR WITH MESH TAP BLOCK;  Surgeon: Hannah Boston, MD;  Location: [place];  Service: General;  Laterality: Bilateral;   LAPAROSCOPY N/A 02/24/2022   Procedure: LAPAROSCOPY DIAGNOSTIC;  Surgeon: Hannah Boston, MD;  Location: [place];  Service: General;  Laterality: N/A;   TUBAL LIGATION     Patient Active Problem List   Diagnosis Date Noted   Lacunar infarction 05/19/2022   Bulging of cervical intervertebral disc 04/10/2022   Subjective cognitive impairment 02/24/2022   BMI 35.0-35.9,adult 12/28/2021   Inguinal pain, right 12/28/2021    Situational anxiety 08/18/2021   Asthma 08/18/2021   Essential (primary) hypertension 08/18/2021   Vitamin D deficiency 08/18/2021    PCP: Hannah Gouty, FNP   REFERRING PROVIDER: Burnard Hawthorne, NP  REFERRING DIAG:  Diagnosis  M54.6 (ICD-10-CM) - Pain in thoracic spine    Rationale for Evaluation and Treatment: Rehabilitation  THERAPY DIAG:  Muscle weakness (generalized)  Pain in thoracic spine  Cramp and spasm  ONSET DATE: 08/05/22  SUBJECTIVE:  SUBJECTIVE STATEMENT: Pt states that she has been able to do her exercises a little better.  Pt reports feeling overall approximately 25% better since initial evaluation.  PERTINENT HISTORY:  CVA 2023, TIA 09/04/22  PAIN:  Are you having pain? Yes: NPRS scale: 4/10 Pain location: Lt thoracic  Pain description: deep aching, sometimes electricity Aggravating factors: moving, breathing too hard, hitting a bump in the car Relieving factors: nothing  PRECAUTIONS: None  WEIGHT BEARING RESTRICTIONS: No  FALLS:  Has patient fallen in last 6 months? No  LIVING ENVIRONMENT: Lives with: lives with their family Lives in: House/apartment   OCCUPATION: no- lost job after CVA  PLOF: Independent, and Leisure: walking and hiking  PATIENT GOALS: reduce Lt thoracic pain, do housework without limitation  NEXT MD VISIT: May/June 2024  OBJECTIVE:   DIAGNOSTIC FINDINGS:  X-ray: 08/28/22 No evidence of thoracic spine fracture.  Mild multilevel degenerative changes.  PATIENT SURVEYS:  09/20/22:FOTO 37 (goal is 52)  SCREENING FOR RED FLAGS: Bowel or bladder incontinence: No Spinal tumors: No Cauda equina syndrome: No Compression fracture: No Abdominal aneurysm: No  COGNITION: Overall cognitive status: History of cognitive impairments  - at baseline Some memory deficits after CVA in 2023.     SENSATION: WFL   POSTURE: rounded shoulders, forward head, and increased thoracic kyphosis  PALPATION: Reduced thoracic mobility with PA mobs T4-8.  Tension in Lt thoracic paraspinals with pain.    Cervical A/ROM:  Full without pain  LOWER EXTREMITY ROM:    UE A/ROM: full with pain on the Lt  LOWER EXTREMITY MMT:   Lt UE 4+/5, Rt 5/5.  Lt UE weakness residual after CVA  GAIT: Distance walked: 50 Assistive device utilized: None Level of assistance: Complete Independence Comments: symmetry  TODAY'S TREATMENT:                                                                                                                               DATE: 10/03/2022  Nustep level 3 x6 min with PT present to discuss status Quadruped cat/cow 2x10 Childs Pose x20 seconds [name] with side bending x20 sec bilat Sidelying open book x10 bilat Seated rows and shoulder extension with red tband.  2x10 each Seated shoulder rolls 2x10 Trigger Point Dry-Needling  Treatment instructions: Expect mild to moderate muscle soreness. S/S of pneumothorax if dry needled over a lung field, and to seek immediate medical attention should they occur. Patient verbalized understanding of these instructions and education. Patient Consent Given: Yes Education handout provided: Yes Muscles treated: bilateral thoracic multifidi, bilat rhomboids, bilat upper traps Electrical stimulation performed: No Parameters: N/A Treatment response/outcome: Utilized skilled palpation to locate/identify trigger points with bony landmarks.  Able to illicit twitch response and muscle elongation Manual Therapy: Followed with soft tissue mobilization to lumbar and thoracic spine and paraspinal musculature.    DATE: 09/25/2022  Nustep level 3 x5 min with PT present to discuss status Quadruped cat/cow 2x10 Hannah Ray 2x20  seconds Hannah Ray with side bending x20 sec  bilat Sidelying open book x10 lying on right side Seated open book x10 to right side (secondary to being unable to tolerate lying on left side Trigger Point Dry-Needling  Treatment instructions: Expect mild to moderate muscle soreness. S/S of pneumothorax if dry needled over a lung field, and to seek immediate medical attention should they occur. Patient verbalized understanding of these instructions and education. Patient Consent Given: Yes Education handout provided: Yes Muscles treated: bilateral thoracic multifidi, left rhomboids Electrical stimulation performed: No Parameters: N/A Treatment response/outcome: Utilized skilled palpation to locate/identify trigger points.  Able to illicit twitch response and muscle elongation Manual Therapy:  Performed myofascial glide using suction cup gliding to further promote tissue movement and elongation.  Followed with soft tissue mobilization to lumbar and thoracic spine and paraspinal musculature.     PATIENT EDUCATION:  Education details: Access Code: [email or web address] Person educated: Patient Education method: Explanation, Demonstration, and Handouts Education comprehension: verbalized understanding and returned demonstration  HOME EXERCISE PROGRAM: Access Code: [email or web address] URL: [email or web address] Date: 09/20/2022 Prepared by: [place] Thoracic Rotation with Knee on Foam Roll  - 2 x daily - 7 x weekly - 1 sets - 10 reps - Cat-Camel  - 2 x daily - 7 x weekly - 1 sets - 10 reps - 5 hold - Child's Pose Stretch  - 1 x daily - 7 x weekly - 3 sets - 10 reps - Child's Pose with Sidebending  - 1 x daily - 7 x weekly - 3 sets - 10 reps - Seated Correct Posture  - 1 x daily - 7 x weekly - 3 sets - 10 reps  ASSESSMENT:  CLINICAL IMPRESSION: Ms Berenguer presents to skilled PT reporting that overall, she has noted some improvement in symptoms.  Patient does still admit to having moments of increased pain, but has  some times with decreased pain.  Patient did report some improvements following dry needling last session, so was agreeable to the treatment again.  Utilized manual therapy following for soft tissue mobilization and noted decreased trigger points following.  Patient continues to progress towards goal related activities and decreased pain.   OBJECTIVE IMPAIRMENTS: decreased activity tolerance, decreased ROM, decreased strength, hypomobility, increased muscle spasms, impaired flexibility, improper body mechanics, postural dysfunction, and pain.   ACTIVITY LIMITATIONS: carrying and lifting  PARTICIPATION LIMITATIONS: meal prep, cleaning, laundry, and driving  PERSONAL FACTORS: 1 comorbidity: CVA/TIA  are also affecting patient's functional outcome.   REHAB POTENTIAL: Good  CLINICAL DECISION MAKING: Stable/uncomplicated  EVALUATION COMPLEXITY: Low   GOALS: Goals reviewed with patient? Yes  SHORT TERM GOALS: Target date: 10/18/2022    Be independent in initial HEP Baseline: Goal status: IN PROGRESS  2.  Report > or = to 30% reduction in thoracic pain with daily tasks and cleaning  Baseline:  Goal status: INITIAL  3.  Verbalize and demonstrate postural corrections with sitting and daily tasks  Baseline:  Goal status: IN PROGRESS   LONG TERM GOALS: Target date: 11/15/2022    Be independent in advanced HEP Baseline:  Goal status: INITIAL  2.  Report > or = to 70% reduction in thoracic pain with daily tasks and cleaning  Baseline:  4-10/10 Goal status: INITIAL  3.  Improve FOTO to > or = to 62  Baseline: 37 Goal status: INITIAL  4.  Perform housework without limitation due to thoracic pain Baseline:  Goal status: INITIAL   PLAN:  PT  FREQUENCY: 1-2x/week  PT DURATION: 8 weeks  PLANNED INTERVENTIONS: Therapeutic exercises, Therapeutic activity, Neuromuscular re-education, Balance training, Gait training, Patient/Family education, Self Care, Joint mobilization, Joint  manipulation, Aquatic Therapy, Dry Needling, Electrical stimulation, Cryotherapy, Moist heat, Taping, Traction, Manual therapy, and Re-evaluation.  PLAN FOR NEXT SESSION: DN to thoracic, thoracic mobility, postural strength.   Juel Burrow, PT 10/03/22 12:11 PM   [facility] [address], [place], [place] [address] Phone # [phone] Fax [phone]

## 2022-10-09 ENCOUNTER — Ambulatory Visit: Payer: BC Managed Care – PPO | Admitting: Rehabilitative and Restorative Service Providers"

## 2022-10-09 ENCOUNTER — Encounter: Payer: Self-pay | Admitting: Rehabilitative and Restorative Service Providers"

## 2022-10-09 DIAGNOSIS — M6281 Muscle weakness (generalized): Secondary | ICD-10-CM

## 2022-10-09 DIAGNOSIS — M546 Pain in thoracic spine: Secondary | ICD-10-CM

## 2022-10-09 DIAGNOSIS — R252 Cramp and spasm: Secondary | ICD-10-CM | POA: Diagnosis not present

## 2022-10-09 NOTE — Therapy (Signed)
OUTPATIENT PHYSICAL THERAPY TREATMENT NOTE   Patient Name: Hannah Ray MRN: [id number] DOB:09/17/1975, 47 y.o., female Today's Date: 10/09/2022  END OF SESSION:  PT End of Session - 10/09/22 0805     Visit Number 4    Date for PT Re-Evaluation 11/15/22    Authorization Type BCBS    PT Start Time 0800    PT Stop Time 0840    PT Time Calculation (min) 40 min    Activity Tolerance Patient tolerated treatment well    Behavior During Therapy [facility] for tasks assessed/performed             Past Medical History:  Diagnosis Date   Asthma    BMI 35.0-35.9,adult 12/28/2021   Bulging of cervical intervertebral disc 04/10/2022   Complaints of memory disturbance 02/24/2022   Essential (primary) hypertension 08/18/2021   IBS (irritable bowel syndrome)    Inguinal pain, right 12/28/2021   Lacunar infarction    left cerebellar hemisphere   Migraines    PONV (postoperative nausea and vomiting)    Scope patch helps   Situational anxiety 08/18/2021   Vitamin D deficiency 08/18/2021   Past Surgical History:  Procedure Laterality Date   CESAREAN SECTION  2002   w/ tubal ligation   CHOLECYSTECTOMY  1997   HERNIA REPAIR  2004   INGUINAL HERNIA REPAIR Bilateral 02/24/2022   Procedure: LAPAROSCOPIC ABDOMINAL LYSIS OF ADHESIONS, RIGHT TRIPLE NEURECTOMY, RIGHT FEMORAL L HERNIA REPAIR, LEFT INGUINAL HERNIA  REPAIR WITH MESH TAP BLOCK;  Surgeon: Michael Boston, MD;  Location: [place];  Service: General;  Laterality: Bilateral;   LAPAROSCOPY N/A 02/24/2022   Procedure: LAPAROSCOPY DIAGNOSTIC;  Surgeon: Michael Boston, MD;  Location: [place];  Service: General;  Laterality: N/A;   TUBAL LIGATION     Patient Active Problem List   Diagnosis Date Noted   Lacunar infarction 05/19/2022   Bulging of cervical intervertebral disc 04/10/2022   Subjective cognitive impairment 02/24/2022   BMI 35.0-35.9,adult 12/28/2021   Inguinal pain, right 12/28/2021    Situational anxiety 08/18/2021   Asthma 08/18/2021   Essential (primary) hypertension 08/18/2021   Vitamin D deficiency 08/18/2021    PCP: Baruch Gouty, FNP   REFERRING PROVIDER: Burnard Hawthorne, NP  REFERRING DIAG:  Diagnosis  M54.6 (ICD-10-CM) - Pain in thoracic spine    Rationale for Evaluation and Treatment: Rehabilitation  THERAPY DIAG:  Muscle weakness (generalized)  Pain in thoracic spine  Cramp and spasm  ONSET DATE: 08/05/22  SUBJECTIVE:  SUBJECTIVE STATEMENT: Pt reports that she does feel that dry needling is helping.  States 4/10 pain at rest.  PERTINENT HISTORY:  CVA 2023, TIA 09/04/22  PAIN:  Are you having pain? Yes: NPRS scale: 4/10 Pain location: Lt thoracic  Pain description: deep aching, sometimes electricity Aggravating factors: moving, breathing too hard, hitting a bump in the car Relieving factors: nothing  PRECAUTIONS: None  WEIGHT BEARING RESTRICTIONS: No  FALLS:  Has patient fallen in last 6 months? No  LIVING ENVIRONMENT: Lives with: lives with their family Lives in: House/apartment   OCCUPATION: no- lost job after CVA  PLOF: Independent, and Leisure: walking and hiking  PATIENT GOALS: reduce Lt thoracic pain, do housework without limitation  NEXT MD VISIT: May/June 2024  OBJECTIVE:   DIAGNOSTIC FINDINGS:  X-ray: 08/28/22 No evidence of thoracic spine fracture.  Mild multilevel degenerative changes.  PATIENT SURVEYS:  09/20/22:FOTO 37 (goal is 36)  SCREENING FOR RED FLAGS: Bowel or bladder incontinence: No Spinal tumors: No Cauda equina syndrome: No Compression fracture: No Abdominal aneurysm: No  COGNITION: Overall cognitive status: History of cognitive impairments - at baseline Some memory deficits after CVA in  2023.     SENSATION: WFL   POSTURE: rounded shoulders, forward head, and increased thoracic kyphosis  PALPATION: Reduced thoracic mobility with PA mobs T4-8.  Tension in Lt thoracic paraspinals with pain.    Cervical A/ROM:  Full without pain  LOWER EXTREMITY ROM:    UE A/ROM: full with pain on the Lt  LOWER EXTREMITY MMT:   Lt UE 4+/5, Rt 5/5.  Lt UE weakness residual after CVA  GAIT: Distance walked: 50 Assistive device utilized: None Level of assistance: Complete Independence Comments: symmetry  TODAY'S TREATMENT:                                                                                                                               DATE: 10/03/2022  Nustep level 3 x6 min with PT present to discuss status Quadruped cat/cow 2x10 Childs Pose 2x20 seconds [name] with side bending 2x20 sec bilat Quadruped with reach under to opposite side x5 bilat Sidelying open book x10 bilat Seated rows and shoulder extension with red tband.  2x10 each Seated shoulder ER and horizontal abduction with red tband 2x10 each Trigger Point Dry-Needling  Treatment instructions: Expect mild to moderate muscle soreness. S/S of pneumothorax if dry needled over a lung field, and to seek immediate medical attention should they occur. Patient verbalized understanding of these instructions and education. Patient Consent Given: Yes Education handout provided: Yes Muscles treated: bilateral thoracic multifidi, left rhomboids, left upper traps Electrical stimulation performed: No Parameters: N/A Treatment response/outcome: Utilized skilled palpation to locate/identify trigger points with bony landmarks.  Able to illicit twitch response and muscle elongation Manual Therapy: Followed with soft tissue mobilization to lumbar and thoracic spine and paraspinal musculature.    DATE: 10/03/2022  Nustep level 3 x6 min with PT present to  discuss status Quadruped cat/cow 2x10 Childs Pose x20  seconds [name] with side bending x20 sec bilat Sidelying open book x10 bilat Seated rows and shoulder extension with red tband.  2x10 each Seated shoulder rolls 2x10 Trigger Point Dry-Needling  Treatment instructions: Expect mild to moderate muscle soreness. S/S of pneumothorax if dry needled over a lung field, and to seek immediate medical attention should they occur. Patient verbalized understanding of these instructions and education. Patient Consent Given: Yes Education handout provided: Yes Muscles treated: bilateral thoracic multifidi, bilat rhomboids, bilat upper traps Electrical stimulation performed: No Parameters: N/A Treatment response/outcome: Utilized skilled palpation to locate/identify trigger points with bony landmarks.  Able to illicit twitch response and muscle elongation Manual Therapy: Followed with soft tissue mobilization to lumbar and thoracic spine and paraspinal musculature.    DATE: 09/25/2022  Nustep level 3 x5 min with PT present to discuss status Quadruped cat/cow 2x10 Ardine Eng Pose 2x20 seconds [name] with side bending x20 sec bilat Sidelying open book x10 lying on right side Seated open book x10 to right side (secondary to being unable to tolerate lying on left side Trigger Point Dry-Needling  Treatment instructions: Expect mild to moderate muscle soreness. S/S of pneumothorax if dry needled over a lung field, and to seek immediate medical attention should they occur. Patient verbalized understanding of these instructions and education. Patient Consent Given: Yes Education handout provided: Yes Muscles treated: bilateral thoracic multifidi, left rhomboids Electrical stimulation performed: No Parameters: N/A Treatment response/outcome: Utilized skilled palpation to locate/identify trigger points.  Able to illicit twitch response and muscle elongation Manual Therapy:  Performed myofascial glide using suction cup gliding to further promote tissue  movement and elongation.  Followed with soft tissue mobilization to lumbar and thoracic spine and paraspinal musculature.     PATIENT EDUCATION:  Education details: Access Code: [email or web address] Person educated: Patient Education method: Explanation, Demonstration, and Handouts Education comprehension: verbalized understanding and returned demonstration  HOME EXERCISE PROGRAM: Access Code: [email or web address] URL: [email or web address] Date: 10/09/2022 Prepared by: Gerald Thoracic Rotation with Knee on Foam Roll  - 2 x daily - 7 x weekly - 1 sets - 10 reps - Cat-Camel  - 2 x daily - 7 x weekly - 1 sets - 10 reps - 5 hold - Child's Pose Stretch  - 1 x daily - 7 x weekly - 3 sets - 10 reps - Child's Pose with Sidebending  - 1 x daily - 7 x weekly - 3 sets - 10 reps - Seated Correct Posture  - 1 x daily - 7 x weekly - 3 sets - 10 reps - Standing Shoulder Row with Anchored Resistance  - 1 x daily - 7 x weekly - 2 sets - 10 reps - Shoulder extension with resistance - Neutral  - 1 x daily - 7 x weekly - 2 sets - 10 reps - Shoulder External Rotation and Scapular Retraction with Resistance  - 1 x daily - 7 x weekly - 2 sets - 10 reps - Standing Shoulder Horizontal Abduction with Resistance  - 1 x daily - 7 x weekly - 2 sets - 10 reps  ASSESSMENT:  CLINICAL IMPRESSION: Ms Beauchesne presents to skilled PT reporting that the DN does seem to be helping, but still feels a "pinched" feeling at times.  Patient able to progress with strengthening and provided with updated HEP and red tband for home use.  Patient continues to require strengthening of scapular muscles to assist  with improved posture.   OBJECTIVE IMPAIRMENTS: decreased activity tolerance, decreased ROM, decreased strength, hypomobility, increased muscle spasms, impaired flexibility, improper body mechanics, postural dysfunction, and pain.   ACTIVITY LIMITATIONS: carrying and lifting  PARTICIPATION  LIMITATIONS: meal prep, cleaning, laundry, and driving  PERSONAL FACTORS: 1 comorbidity: CVA/TIA  are also affecting patient's functional outcome.   REHAB POTENTIAL: Good  CLINICAL DECISION MAKING: Stable/uncomplicated  EVALUATION COMPLEXITY: Low   GOALS: Goals reviewed with patient? Yes  SHORT TERM GOALS: Target date: 10/18/2022    Be independent in initial HEP Baseline: Goal status: IN PROGRESS  2.  Report > or = to 30% reduction in thoracic pain with daily tasks and cleaning  Baseline:  Goal status: IN PROGRESS  3.  Verbalize and demonstrate postural corrections with sitting and daily tasks  Baseline:  Goal status: IN PROGRESS   LONG TERM GOALS: Target date: 11/15/2022    Be independent in advanced HEP Baseline:  Goal status: INITIAL  2.  Report > or = to 70% reduction in thoracic pain with daily tasks and cleaning  Baseline:  4-10/10 Goal status: INITIAL  3.  Improve FOTO to > or = to 62  Baseline: 37 Goal status: INITIAL  4.  Perform housework without limitation due to thoracic pain Baseline:  Goal status: INITIAL   PLAN:  PT FREQUENCY: 1-2x/week  PT DURATION: 8 weeks  PLANNED INTERVENTIONS: Therapeutic exercises, Therapeutic activity, Neuromuscular re-education, Balance training, Gait training, Patient/Family education, Self Care, Joint mobilization, Joint manipulation, Aquatic Therapy, Dry Needling, Electrical stimulation, Cryotherapy, Moist heat, Taping, Traction, Manual therapy, and Re-evaluation.  PLAN FOR NEXT SESSION: DN to thoracic, thoracic mobility, postural strength.   Juel Burrow, PT 10/09/22 8:47 AM   [facility] [address], [place], [place] [address] Phone # [phone] Fax [phone]

## 2022-10-10 DIAGNOSIS — I10 Essential (primary) hypertension: Secondary | ICD-10-CM | POA: Diagnosis not present

## 2022-10-10 DIAGNOSIS — Z8673 Personal history of transient ischemic attack (TIA), and cerebral infarction without residual deficits: Secondary | ICD-10-CM | POA: Diagnosis not present

## 2022-10-10 DIAGNOSIS — Z1330 Encounter for screening examination for mental health and behavioral disorders, unspecified: Secondary | ICD-10-CM | POA: Diagnosis not present

## 2022-10-10 DIAGNOSIS — G459 Transient cerebral ischemic attack, unspecified: Secondary | ICD-10-CM | POA: Diagnosis not present

## 2022-10-10 DIAGNOSIS — E119 Type 2 diabetes mellitus without complications: Secondary | ICD-10-CM | POA: Diagnosis not present

## 2022-10-11 DIAGNOSIS — E119 Type 2 diabetes mellitus without complications: Secondary | ICD-10-CM | POA: Diagnosis not present

## 2022-10-11 DIAGNOSIS — Z6841 Body Mass Index (BMI) 40.0 and over, adult: Secondary | ICD-10-CM | POA: Diagnosis not present

## 2022-10-11 DIAGNOSIS — Z13220 Encounter for screening for lipoid disorders: Secondary | ICD-10-CM | POA: Diagnosis not present

## 2022-10-11 DIAGNOSIS — R931 Abnormal findings on diagnostic imaging of heart and coronary circulation: Secondary | ICD-10-CM | POA: Diagnosis not present

## 2022-10-11 DIAGNOSIS — I10 Essential (primary) hypertension: Secondary | ICD-10-CM | POA: Diagnosis not present

## 2022-10-11 DIAGNOSIS — E041 Nontoxic single thyroid nodule: Secondary | ICD-10-CM | POA: Diagnosis not present

## 2022-10-11 DIAGNOSIS — Z0000 Encounter for general adult medical examination without abnormal findings: Secondary | ICD-10-CM | POA: Diagnosis not present

## 2022-10-17 ENCOUNTER — Ambulatory Visit: Payer: BC Managed Care – PPO | Attending: Nurse Practitioner

## 2022-10-17 DIAGNOSIS — R252 Cramp and spasm: Secondary | ICD-10-CM | POA: Insufficient documentation

## 2022-10-17 DIAGNOSIS — M6281 Muscle weakness (generalized): Secondary | ICD-10-CM | POA: Diagnosis not present

## 2022-10-17 DIAGNOSIS — M546 Pain in thoracic spine: Secondary | ICD-10-CM | POA: Insufficient documentation

## 2022-10-17 NOTE — Therapy (Signed)
OUTPATIENT PHYSICAL THERAPY TREATMENT NOTE   Patient Name: Hannah Ray MRN: [id number] DOB:11-10-1975, 47 y.o., female Today's Date: 10/17/2022  END OF SESSION:  PT End of Session - 10/17/22 0836     Visit Number 5    Date for PT Re-Evaluation 11/15/22    Authorization Type BCBS    PT Start Time 0802    PT Stop Time [id number]    PT Time Calculation (min) 35 min    Activity Tolerance Patient tolerated treatment well    Behavior During Therapy [facility] for tasks assessed/performed              Past Medical History:  Diagnosis Date   Asthma    BMI 35.0-35.9,adult 12/28/2021   Bulging of cervical intervertebral disc 04/10/2022   Complaints of memory disturbance 02/24/2022   Essential (primary) hypertension 08/18/2021   IBS (irritable bowel syndrome)    Inguinal pain, right 12/28/2021   Lacunar infarction    left cerebellar hemisphere   Migraines    PONV (postoperative nausea and vomiting)    Scope patch helps   Situational anxiety 08/18/2021   Vitamin D deficiency 08/18/2021   Past Surgical History:  Procedure Laterality Date   CESAREAN SECTION  2002   w/ tubal ligation   CHOLECYSTECTOMY  1997   HERNIA REPAIR  2004   INGUINAL HERNIA REPAIR Bilateral 02/24/2022   Procedure: LAPAROSCOPIC ABDOMINAL LYSIS OF ADHESIONS, RIGHT TRIPLE NEURECTOMY, RIGHT FEMORAL L HERNIA REPAIR, LEFT INGUINAL HERNIA  REPAIR WITH MESH TAP BLOCK;  Surgeon: Michael Boston, MD;  Location: [place];  Service: General;  Laterality: Bilateral;   LAPAROSCOPY N/A 02/24/2022   Procedure: LAPAROSCOPY DIAGNOSTIC;  Surgeon: Michael Boston, MD;  Location: [place];  Service: General;  Laterality: N/A;   TUBAL LIGATION     Patient Active Problem List   Diagnosis Date Noted   Lacunar infarction 05/19/2022   Bulging of cervical intervertebral disc 04/10/2022   Subjective cognitive impairment 02/24/2022   BMI 35.0-35.9,adult 12/28/2021   Inguinal pain, right 12/28/2021    Situational anxiety 08/18/2021   Asthma 08/18/2021   Essential (primary) hypertension 08/18/2021   Vitamin D deficiency 08/18/2021    PCP: Baruch Gouty, FNP   REFERRING PROVIDER: Burnard Hawthorne, NP  REFERRING DIAG:  Diagnosis  M54.6 (ICD-10-CM) - Pain in thoracic spine    Rationale for Evaluation and Treatment: Rehabilitation  THERAPY DIAG:  Muscle weakness (generalized)  Pain in thoracic spine  Cramp and spasm  ONSET DATE: 08/05/22  SUBJECTIVE:  SUBJECTIVE STATEMENT: The position of the stretching exercises make me feel worse. The bands are ok.  I feel about 40% better.  I feel like I have a slipped disc.     PERTINENT HISTORY:  CVA 2023, TIA 09/04/22  PAIN:  Are you having pain? Yes: NPRS scale: 4/10 Pain location: Lt thoracic  Pain description: deep aching, sometimes electricity Aggravating factors: moving, breathing too hard, hitting a bump in the car Relieving factors: nothing  PRECAUTIONS: None  WEIGHT BEARING RESTRICTIONS: No  FALLS:  Has patient fallen in last 6 months? No  LIVING ENVIRONMENT: Lives with: lives with their family Lives in: House/apartment   OCCUPATION: no- lost job after CVA  PLOF: Independent, and Leisure: walking and hiking  PATIENT GOALS: reduce Lt thoracic pain, do housework without limitation  NEXT MD VISIT: May/June 2024  OBJECTIVE:   DIAGNOSTIC FINDINGS:  X-ray: 08/28/22 No evidence of thoracic spine fracture.  Mild multilevel degenerative changes.  PATIENT SURVEYS:  09/20/22:FOTO 37 (goal is 73)  SCREENING FOR RED FLAGS: Bowel or bladder incontinence: No Spinal tumors: No Cauda equina syndrome: No Compression fracture: No Abdominal aneurysm: No  COGNITION: Overall cognitive status: History of cognitive impairments - at  baseline Some memory deficits after CVA in 2023.     SENSATION: WFL   POSTURE: rounded shoulders, forward head, and increased thoracic kyphosis  PALPATION: Reduced thoracic mobility with PA mobs T4-8.  Tension in Lt thoracic paraspinals with pain.    Cervical A/ROM:  Full without pain  LOWER EXTREMITY ROM:    UE A/ROM: full with pain on the Lt  LOWER EXTREMITY MMT:   Lt UE 4+/5, Rt 5/5.  Lt UE weakness residual after CVA  GAIT: Distance walked: 50 Assistive device utilized: None Level of assistance: Complete Independence Comments: symmetry  TODAY'S TREATMENT:        DATE: 10/17/2022  Nustep level 3 x6 min with PT present to discuss status Ardine Eng pose at counter top  Seated rows and shoulder extension with red tband.  2x10 each Seated shoulder ER and horizontal abduction with red tband 2x10 each Trigger Point Dry-Needling  Treatment instructions: Expect mild to moderate muscle soreness. S/S of pneumothorax if dry needled over a lung field, and to seek immediate medical attention should they occur. Patient verbalized understanding of these instructions and education. Patient Consent Given: Yes Education handout provided: Yes Muscles treated: bilateral thoracic multifidi, left rhomboids, left upper traps Electrical stimulation performed: No Parameters: N/A Treatment response/outcome: Utilized skilled palpation to locate/identify trigger points with bony landmarks.  Able to illicit twitch response and muscle elongation Manual Therapy: Followed with soft tissue mobilization to lumbar and thoracic spine and paraspinal musculature.                                                                                                                           DATE: 10/03/2022  Nustep level 3 x6 min with PT present to discuss status Quadruped cat/cow 2x10 [name] 2x20  seconds [name] with side bending 2x20 sec bilat Quadruped with reach under to opposite side x5 bilat Sidelying  open book x10 bilat Seated rows and shoulder extension with red tband.  2x10 each Seated shoulder ER and horizontal abduction with red tband 2x10 each Trigger Point Dry-Needling  Treatment instructions: Expect mild to moderate muscle soreness. S/S of pneumothorax if dry needled over a lung field, and to seek immediate medical attention should they occur. Patient verbalized understanding of these instructions and education. Patient Consent Given: Yes Education handout provided: Yes Muscles treated: bilateral thoracic multifidi, left rhomboids, left upper traps Electrical stimulation performed: No Parameters: N/A Treatment response/outcome: Utilized skilled palpation to locate/identify trigger points with bony landmarks.  Able to illicit twitch response and muscle elongation Manual Therapy: Followed with soft tissue mobilization to lumbar and thoracic spine and paraspinal musculature.    DATE: 10/03/2022  Nustep level 3 x6 min with PT present to discuss status Quadruped cat/cow 2x10 Ardine Eng Pose x20 seconds [name] with side bending x20 sec bilat Sidelying open book x10 bilat Seated rows and shoulder extension with red tband.  2x10 each Seated shoulder rolls 2x10 Trigger Point Dry-Needling  Treatment instructions: Expect mild to moderate muscle soreness. S/S of pneumothorax if dry needled over a lung field, and to seek immediate medical attention should they occur. Patient verbalized understanding of these instructions and education. Patient Consent Given: Yes Education handout provided: Yes Muscles treated: bilateral thoracic multifidi, bilat rhomboids, bilat upper traps Electrical stimulation performed: No Parameters: N/A Treatment response/outcome: Utilized skilled palpation to locate/identify trigger points with bony landmarks.  Able to illicit twitch response and muscle elongation Manual Therapy: Followed with soft tissue mobilization to lumbar and thoracic spine and paraspinal  musculature.    PATIENT EDUCATION:  Education details: Access Code: [email or web address] Person educated: Patient Education method: Explanation, Demonstration, and Handouts Education comprehension: verbalized understanding and returned demonstration  HOME EXERCISE PROGRAM: Access Code: [email or web address] URL: [email or web address] Date: 10/17/2022 Prepared by: Claiborne Billings  Exercises - Child's Pose Stretch  - 1 x daily - 7 x weekly - 3 sets - 10 reps - Child's Pose with Sidebending  - 1 x daily - 7 x weekly - 3 sets - 10 reps - Seated Correct Posture  - 1 x daily - 7 x weekly - 3 sets - 10 reps - Standing Shoulder Row with Anchored Resistance  - 1 x daily - 7 x weekly - 2 sets - 10 reps - Shoulder extension with resistance - Neutral  - 1 x daily - 7 x weekly - 2 sets - 10 reps - Shoulder External Rotation and Scapular Retraction with Resistance  - 1 x daily - 7 x weekly - 2 sets - 10 reps - Standing Shoulder Horizontal Abduction with Resistance  - 1 x daily - 7 x weekly - 2 sets - 10 reps - Seated Trunk Rotation - Arms Crossed  - 2 x daily - 7 x weekly - 1 sets - 3 reps - Standing Lumbar Spine Flexion Stretch Counter  - 2 x daily - 7 x weekly - 1 sets - 3 reps - 20 hold  ASSESSMENT:  CLINICAL IMPRESSION: Pt reports that quadruped flexibility exercises are aggravating to her pain.  PT modified HEP today to reflect this.  Pt does report 40% overall pain reduction since the start of care and has met all STGs. She had good response to DN with twitch and improved tissue mobility with this today.   Patient continues to require strengthening of  scapular muscles to assist with improved posture.   OBJECTIVE IMPAIRMENTS: decreased activity tolerance, decreased ROM, decreased strength, hypomobility, increased muscle spasms, impaired flexibility, improper body mechanics, postural dysfunction, and pain.   ACTIVITY LIMITATIONS: carrying and lifting  PARTICIPATION LIMITATIONS: meal prep, cleaning, laundry, and  driving  PERSONAL FACTORS: 1 comorbidity: CVA/TIA  are also affecting patient's functional outcome.   REHAB POTENTIAL: Good  CLINICAL DECISION MAKING: Stable/uncomplicated  EVALUATION COMPLEXITY: Low   GOALS: Goals reviewed with patient? Yes  SHORT TERM GOALS: Target date: 10/18/2022    Be independent in initial HEP Baseline: Goal status: MET  2.  Report > or = to 30% reduction in thoracic pain with daily tasks and cleaning  Baseline: 40% (10/17/22) Goal status: MET  3.  Verbalize and demonstrate postural corrections with sitting and daily tasks  Baseline: doing this frequently (10/17/22) Goal status: MET   LONG TERM GOALS: Target date: 11/15/2022    Be independent in advanced HEP Baseline:  Goal status: INITIAL  2.  Report > or = to 70% reduction in thoracic pain with daily tasks and cleaning  Baseline:  4-10/10 Goal status: INITIAL  3.  Improve FOTO to > or = to 62  Baseline: 37 Goal status: INITIAL  4.  Perform housework without limitation due to thoracic pain Baseline:  Goal status: INITIAL   PLAN:  PT FREQUENCY: 1-2x/week  PT DURATION: 8 weeks  PLANNED INTERVENTIONS: Therapeutic exercises, Therapeutic activity, Neuromuscular re-education, Balance training, Gait training, Patient/Family education, Self Care, Joint mobilization, Joint manipulation, Aquatic Therapy, Dry Needling, Electrical stimulation, Cryotherapy, Moist heat, Taping, Traction, Manual therapy, and Re-evaluation.  PLAN FOR NEXT SESSION: DN to thoracic, thoracic mobility, postural strength.   Sigurd Sos, PT 10/17/22 8:37 AM   [facility] [address], [place], [place] [address] Phone # [phone] Fax [phone]

## 2022-10-18 DIAGNOSIS — I7122 Aneurysm of the aortic arch, without rupture: Secondary | ICD-10-CM | POA: Diagnosis not present

## 2022-10-20 DIAGNOSIS — Z111 Encounter for screening for respiratory tuberculosis: Secondary | ICD-10-CM | POA: Diagnosis not present

## 2022-10-20 DIAGNOSIS — Z23 Encounter for immunization: Secondary | ICD-10-CM | POA: Diagnosis not present

## 2022-10-23 ENCOUNTER — Ambulatory Visit: Payer: BC Managed Care – PPO

## 2022-10-25 DIAGNOSIS — N83202 Unspecified ovarian cyst, left side: Secondary | ICD-10-CM | POA: Diagnosis not present

## 2022-10-25 DIAGNOSIS — I10 Essential (primary) hypertension: Secondary | ICD-10-CM | POA: Diagnosis not present

## 2022-10-25 DIAGNOSIS — N924 Excessive bleeding in the premenopausal period: Secondary | ICD-10-CM | POA: Diagnosis not present

## 2022-10-30 ENCOUNTER — Ambulatory Visit: Payer: BC Managed Care – PPO

## 2022-11-13 ENCOUNTER — Ambulatory Visit: Payer: BC Managed Care – PPO | Attending: Nurse Practitioner

## 2022-11-13 DIAGNOSIS — R252 Cramp and spasm: Secondary | ICD-10-CM | POA: Diagnosis not present

## 2022-11-13 DIAGNOSIS — M6281 Muscle weakness (generalized): Secondary | ICD-10-CM | POA: Insufficient documentation

## 2022-11-13 DIAGNOSIS — M546 Pain in thoracic spine: Secondary | ICD-10-CM | POA: Diagnosis not present

## 2022-11-13 NOTE — Therapy (Signed)
OUTPATIENT PHYSICAL THERAPY TREATMENT NOTE   Patient Name: Hannah Ray MRN: [id number] DOB:01-May-1976, 47 y.o., female Today's Date: 11/13/2022  END OF SESSION:  PT End of Session - 11/13/22 0941     Visit Number 6    Authorization Type [employer]    PT Start Time 0935    PT Stop Time 1000    PT Time Calculation (min) 25 min    Activity Tolerance Patient tolerated treatment well    Behavior During Therapy [facility] for tasks assessed/performed               Past Medical History:  Diagnosis Date   Asthma    BMI 35.0-35.9,adult 12/28/2021   Bulging of cervical intervertebral disc 04/10/2022   Complaints of memory disturbance 02/24/2022   Essential (primary) hypertension 08/18/2021   IBS (irritable bowel syndrome)    Inguinal pain, right 12/28/2021   Lacunar infarction    left cerebellar hemisphere   Migraines    PONV (postoperative nausea and vomiting)    Scope patch helps   Situational anxiety 08/18/2021   Vitamin D deficiency 08/18/2021   Past Surgical History:  Procedure Laterality Date   CESAREAN SECTION  2002   w/ tubal ligation   CHOLECYSTECTOMY  1997   HERNIA REPAIR  2004   INGUINAL HERNIA REPAIR Bilateral 02/24/2022   Procedure: LAPAROSCOPIC ABDOMINAL LYSIS OF ADHESIONS, RIGHT TRIPLE NEURECTOMY, RIGHT FEMORAL L HERNIA REPAIR, LEFT INGUINAL HERNIA  REPAIR WITH MESH TAP BLOCK;  Surgeon: Michael Boston, MD;  Location: [place];  Service: General;  Laterality: Bilateral;   LAPAROSCOPY N/A 02/24/2022   Procedure: LAPAROSCOPY DIAGNOSTIC;  Surgeon: Michael Boston, MD;  Location: [place];  Service: General;  Laterality: N/A;   TUBAL LIGATION     Patient Active Problem List   Diagnosis Date Noted   Lacunar infarction 05/19/2022   Bulging of cervical intervertebral disc 04/10/2022   Subjective cognitive impairment 02/24/2022   BMI 35.0-35.9,adult 12/28/2021   Inguinal pain, right 12/28/2021   Situational anxiety 08/18/2021   Asthma  08/18/2021   Essential (primary) hypertension 08/18/2021   Vitamin D deficiency 08/18/2021    PCP: Baruch Gouty, FNP   REFERRING PROVIDER: Burnard Hawthorne, NP  REFERRING DIAG:  Diagnosis  M54.6 (ICD-10-CM) - Pain in thoracic spine    Rationale for Evaluation and Treatment: Rehabilitation  THERAPY DIAG:  Muscle weakness (generalized)  Pain in thoracic spine  Cramp and spasm  ONSET DATE: 08/05/22  SUBJECTIVE:  SUBJECTIVE STATEMENT: Lapse in treatment due to new work schedule. I'm feeling better overall.  I did a lot of yardwork yesterday so I'm sore. 85% better.    PERTINENT HISTORY:  CVA 2023, TIA 09/04/22  PAIN:  Are you having pain? Yes: NPRS scale: 4/10 Pain location: Lt thoracic  Pain description: deep aching, sometimes electricity Aggravating factors: moving, breathing too hard, hitting a bump in the car Relieving factors: nothing  PRECAUTIONS: None  WEIGHT BEARING RESTRICTIONS: No  FALLS:  Has patient fallen in last 6 months? No  LIVING ENVIRONMENT: Lives with: lives with their family Lives in: House/apartment   OCCUPATION: no- lost job after CVA  PLOF: Independent, and Leisure: walking and hiking  PATIENT GOALS: reduce Lt thoracic pain, do housework without limitation  NEXT MD VISIT: May/June 2024  OBJECTIVE:   DIAGNOSTIC FINDINGS:  X-ray: 08/28/22 No evidence of thoracic spine fracture.  Mild multilevel degenerative changes.  PATIENT SURVEYS:  09/20/22:FOTO 37 (goal is 62) 11/13/22: FOTO 71  SCREENING FOR RED FLAGS: Bowel or bladder incontinence: No Spinal tumors: No Cauda equina syndrome: No Compression fracture: No Abdominal aneurysm: No  COGNITION: Overall cognitive status: History of cognitive impairments - at baseline Some memory deficits  after CVA in 2023.     SENSATION: WFL   POSTURE: rounded shoulders, forward head, and increased thoracic kyphosis  PALPATION: Reduced thoracic mobility with PA mobs T4-8.  Tension in Lt thoracic paraspinals with pain.    Cervical A/ROM:  Full without pain  LOWER EXTREMITY ROM:    UE A/ROM: full with pain on the Lt  LOWER EXTREMITY MMT:   Lt UE 4+/5, Rt 5/5.  Lt UE weakness residual after CVA  GAIT: Distance walked: 50 Assistive device utilized: None Level of assistance: Complete Independence Comments: symmetry  TODAY'S TREATMENT:        DATE: 11/13/2022  Nustep level 3 x6 min with PT present to discuss status Ardine Eng pose at counter top  Standing rows and shoulder extension with green tband.  2x10 each Seated shoulder ER and horizontal abduction with green tband 2x10 each Countertop stretch 3x20 seconds  Seated thoracic rotation 3x20   DATE: 10/17/2022  Nustep level 3 x6 min with PT present to discuss status Ardine Eng pose at counter top  Seated rows and shoulder extension with red tband.  2x10 each Seated shoulder ER and horizontal abduction with red tband 2x10 each Trigger Point Dry-Needling  Treatment instructions: Expect mild to moderate muscle soreness. S/S of pneumothorax if dry needled over a lung field, and to seek immediate medical attention should they occur. Patient verbalized understanding of these instructions and education. Patient Consent Given: Yes Education handout provided: Yes Muscles treated: bilateral thoracic multifidi, left rhomboids, left upper traps Electrical stimulation performed: No Parameters: N/A Treatment response/outcome: Utilized skilled palpation to locate/identify trigger points with bony landmarks.  Able to illicit twitch response and muscle elongation Manual Therapy: Followed with soft tissue mobilization to lumbar and thoracic spine and paraspinal musculature.  DATE: 10/03/2022  Nustep level 3 x6 min with PT present to discuss status Quadruped cat/cow 2x10 Ardine Eng Pose 2x20 seconds [name] with side bending 2x20 sec bilat Quadruped with reach under to opposite side x5 bilat Sidelying open book x10 bilat Seated rows and shoulder extension with red tband.  2x10 each Seated shoulder ER and horizontal abduction with red tband 2x10 each Trigger Point Dry-Needling  Treatment instructions: Expect mild to moderate muscle soreness. S/S of pneumothorax if dry needled over a lung field, and to seek immediate medical attention should they occur. Patient verbalized understanding of these instructions and education. Patient Consent Given: Yes Education handout provided: Yes Muscles treated: bilateral thoracic multifidi, left rhomboids, left upper traps Electrical stimulation performed: No Parameters: N/A Treatment response/outcome: Utilized skilled palpation to locate/identify trigger points with bony landmarks.  Able to illicit twitch response and muscle elongation Manual Therapy: Followed with soft tissue mobilization to lumbar and thoracic spine and paraspinal musculature.    PATIENT EDUCATION:  Education details: Access Code: [email or web address] Person educated: Patient Education method: Explanation, Demonstration, and Handouts Education comprehension: verbalized understanding and returned demonstration  HOME EXERCISE PROGRAM: Access Code: [email or web address] URL: [email or web address] Date: 10/17/2022 Prepared by: Claiborne Billings  Exercises - Child's Pose Stretch  - 1 x daily - 7 x weekly - 3 sets - 10 reps - Child's Pose with Sidebending  - 1 x daily - 7 x weekly - 3 sets - 10 reps - Seated Correct Posture  - 1 x daily - 7 x weekly - 3 sets - 10 reps - Standing Shoulder Row with Anchored Resistance  - 1 x daily - 7 x weekly - 2 sets - 10 reps - Shoulder extension with resistance - Neutral  - 1 x daily - 7 x weekly -  2 sets - 10 reps - Shoulder External Rotation and Scapular Retraction with Resistance  - 1 x daily - 7 x weekly - 2 sets - 10 reps - Standing Shoulder Horizontal Abduction with Resistance  - 1 x daily - 7 x weekly - 2 sets - 10 reps - Seated Trunk Rotation - Arms Crossed  - 2 x daily - 7 x weekly - 1 sets - 3 reps - Standing Lumbar Spine Flexion Stretch Counter  - 2 x daily - 7 x weekly - 1 sets - 3 reps - 20 hold  ASSESSMENT:  CLINICAL IMPRESSION: Pt reports 85% overall improvement in symptoms since the start of care.  FOTO is improved to 71, meeting goal.  Pt has met all goals and will D/C to HEP.  PT issued green band for advancement of exercises after D/C.     OBJECTIVE IMPAIRMENTS: decreased activity tolerance, decreased ROM, decreased strength, hypomobility, increased muscle spasms, impaired flexibility, improper body mechanics, postural dysfunction, and pain.   ACTIVITY LIMITATIONS: carrying and lifting  PARTICIPATION LIMITATIONS: meal prep, cleaning, laundry, and driving  PERSONAL FACTORS: 1 comorbidity: CVA/TIA  are also affecting patient's functional outcome.   REHAB POTENTIAL: Good  CLINICAL DECISION MAKING: Stable/uncomplicated  EVALUATION COMPLEXITY: Low   GOALS: Goals reviewed with patient? Yes  SHORT TERM GOALS: Target date: 10/18/2022    Be independent in initial HEP Baseline: Goal status: MET  2.  Report > or = to 30% reduction in thoracic pain with daily tasks and cleaning  Baseline: 40% (10/17/22) Goal status: MET  3.  Verbalize and demonstrate postural corrections with sitting and daily tasks  Baseline: doing this frequently (10/17/22) Goal status: MET   LONG TERM GOALS: Target date:  11/15/2022    Be independent in advanced HEP Baseline:  Goal status: MET  2.  Report > or = to 70% reduction in thoracic pain with daily tasks and cleaning  Baseline: 85% (11/13/22) Goal status: MET  3.  Improve FOTO to > or = to 62  Baseline: 71 (11/13/22) Goal status:  MET  4.  Perform housework without limitation due to thoracic pain Baseline:  Goal status: MET   PLAN: PHYSICAL THERAPY DISCHARGE SUMMARY  Visits from Start of Care: 6  Current functional level related to goals / functional outcomes: Intermittent thoracic pain that is managed with exercise.     Remaining deficits: No functional deficits remain   Education / Equipment: HEP, posture education   Patient agrees to discharge. Patient goals were met. Patient is being discharged due to meeting the stated rehab goals.    Sigurd Sos, PT 11/13/22 10:06 AM   [facility] [address] [place], [place] [address] Phone # [phone] Fax [phone]

## 2022-11-22 DIAGNOSIS — W57XXXA Bitten or stung by nonvenomous insect and other nonvenomous arthropods, initial encounter: Secondary | ICD-10-CM | POA: Diagnosis not present

## 2022-11-22 DIAGNOSIS — J029 Acute pharyngitis, unspecified: Secondary | ICD-10-CM | POA: Diagnosis not present

## 2022-11-22 DIAGNOSIS — I10 Essential (primary) hypertension: Secondary | ICD-10-CM | POA: Diagnosis not present

## 2022-11-22 DIAGNOSIS — R21 Rash and other nonspecific skin eruption: Secondary | ICD-10-CM | POA: Diagnosis not present

## 2022-11-22 DIAGNOSIS — S30861A Insect bite (nonvenomous) of abdominal wall, initial encounter: Secondary | ICD-10-CM | POA: Diagnosis not present

## 2022-11-30 DIAGNOSIS — N3281 Overactive bladder: Secondary | ICD-10-CM | POA: Diagnosis not present

## 2022-11-30 DIAGNOSIS — N3941 Urge incontinence: Secondary | ICD-10-CM | POA: Diagnosis not present

## 2022-12-18 DIAGNOSIS — I10 Essential (primary) hypertension: Secondary | ICD-10-CM | POA: Diagnosis not present

## 2022-12-18 DIAGNOSIS — E119 Type 2 diabetes mellitus without complications: Secondary | ICD-10-CM | POA: Diagnosis not present

## 2022-12-18 DIAGNOSIS — R21 Rash and other nonspecific skin eruption: Secondary | ICD-10-CM | POA: Diagnosis not present

## 2022-12-26 DIAGNOSIS — E119 Type 2 diabetes mellitus without complications: Secondary | ICD-10-CM | POA: Diagnosis not present

## 2022-12-26 DIAGNOSIS — I10 Essential (primary) hypertension: Secondary | ICD-10-CM | POA: Diagnosis not present

## 2023-01-12 ENCOUNTER — Ambulatory Visit: Payer: BC Managed Care – PPO | Admitting: Neurology

## 2023-01-22 DIAGNOSIS — L570 Actinic keratosis: Secondary | ICD-10-CM | POA: Diagnosis not present

## 2023-01-22 DIAGNOSIS — D225 Melanocytic nevi of trunk: Secondary | ICD-10-CM | POA: Diagnosis not present

## 2023-02-06 DIAGNOSIS — I10 Essential (primary) hypertension: Secondary | ICD-10-CM | POA: Diagnosis not present

## 2023-02-06 DIAGNOSIS — M5414 Radiculopathy, thoracic region: Secondary | ICD-10-CM | POA: Diagnosis not present

## 2023-02-06 DIAGNOSIS — Z1151 Encounter for screening for human papillomavirus (HPV): Secondary | ICD-10-CM | POA: Diagnosis not present

## 2023-02-06 DIAGNOSIS — Z01419 Encounter for gynecological examination (general) (routine) without abnormal findings: Secondary | ICD-10-CM | POA: Diagnosis not present

## 2023-02-06 DIAGNOSIS — N924 Excessive bleeding in the premenopausal period: Secondary | ICD-10-CM | POA: Diagnosis not present

## 2023-02-06 DIAGNOSIS — Z124 Encounter for screening for malignant neoplasm of cervix: Secondary | ICD-10-CM | POA: Diagnosis not present

## 2023-02-06 DIAGNOSIS — N83292 Other ovarian cyst, left side: Secondary | ICD-10-CM | POA: Diagnosis not present

## 2023-02-06 DIAGNOSIS — M47894 Other spondylosis, thoracic region: Secondary | ICD-10-CM | POA: Diagnosis not present

## 2023-02-06 DIAGNOSIS — M47814 Spondylosis without myelopathy or radiculopathy, thoracic region: Secondary | ICD-10-CM | POA: Diagnosis not present

## 2023-02-06 DIAGNOSIS — N83202 Unspecified ovarian cyst, left side: Secondary | ICD-10-CM | POA: Diagnosis not present

## 2023-02-08 DIAGNOSIS — N920 Excessive and frequent menstruation with regular cycle: Secondary | ICD-10-CM | POA: Diagnosis not present

## 2023-02-08 DIAGNOSIS — I10 Essential (primary) hypertension: Secondary | ICD-10-CM | POA: Diagnosis not present

## 2023-02-08 DIAGNOSIS — N83292 Other ovarian cyst, left side: Secondary | ICD-10-CM | POA: Diagnosis not present

## 2023-02-08 DIAGNOSIS — Z6835 Body mass index (BMI) 35.0-35.9, adult: Secondary | ICD-10-CM | POA: Diagnosis not present

## 2023-02-08 DIAGNOSIS — N946 Dysmenorrhea, unspecified: Secondary | ICD-10-CM | POA: Diagnosis not present

## 2023-02-08 DIAGNOSIS — R1900 Intra-abdominal and pelvic swelling, mass and lump, unspecified site: Secondary | ICD-10-CM | POA: Diagnosis not present

## 2023-02-13 DIAGNOSIS — Z79899 Other long term (current) drug therapy: Secondary | ICD-10-CM | POA: Diagnosis not present

## 2023-02-13 DIAGNOSIS — N83202 Unspecified ovarian cyst, left side: Secondary | ICD-10-CM | POA: Diagnosis not present

## 2023-02-13 DIAGNOSIS — E119 Type 2 diabetes mellitus without complications: Secondary | ICD-10-CM | POA: Diagnosis not present

## 2023-02-13 DIAGNOSIS — N924 Excessive bleeding in the premenopausal period: Secondary | ICD-10-CM | POA: Diagnosis not present

## 2023-02-13 DIAGNOSIS — Z01812 Encounter for preprocedural laboratory examination: Secondary | ICD-10-CM | POA: Diagnosis not present

## 2023-02-13 DIAGNOSIS — Z8673 Personal history of transient ischemic attack (TIA), and cerebral infarction without residual deficits: Secondary | ICD-10-CM | POA: Diagnosis not present

## 2023-02-13 DIAGNOSIS — Z7982 Long term (current) use of aspirin: Secondary | ICD-10-CM | POA: Diagnosis not present

## 2023-02-13 DIAGNOSIS — I10 Essential (primary) hypertension: Secondary | ICD-10-CM | POA: Diagnosis not present

## 2023-02-13 DIAGNOSIS — J45909 Unspecified asthma, uncomplicated: Secondary | ICD-10-CM | POA: Diagnosis not present

## 2023-02-13 DIAGNOSIS — Z87891 Personal history of nicotine dependence: Secondary | ICD-10-CM | POA: Diagnosis not present

## 2023-02-13 DIAGNOSIS — Z6835 Body mass index (BMI) 35.0-35.9, adult: Secondary | ICD-10-CM | POA: Diagnosis not present

## 2023-02-13 DIAGNOSIS — Z7984 Long term (current) use of oral hypoglycemic drugs: Secondary | ICD-10-CM | POA: Diagnosis not present

## 2023-02-19 DIAGNOSIS — E042 Nontoxic multinodular goiter: Secondary | ICD-10-CM | POA: Diagnosis not present

## 2023-02-22 DIAGNOSIS — N8003 Adenomyosis of the uterus: Secondary | ICD-10-CM | POA: Diagnosis not present

## 2023-02-22 DIAGNOSIS — N888 Other specified noninflammatory disorders of cervix uteri: Secondary | ICD-10-CM | POA: Diagnosis not present

## 2023-02-22 DIAGNOSIS — Z79899 Other long term (current) drug therapy: Secondary | ICD-10-CM | POA: Diagnosis not present

## 2023-02-22 DIAGNOSIS — E7800 Pure hypercholesterolemia, unspecified: Secondary | ICD-10-CM | POA: Diagnosis not present

## 2023-02-22 DIAGNOSIS — Z7984 Long term (current) use of oral hypoglycemic drugs: Secondary | ICD-10-CM | POA: Diagnosis not present

## 2023-02-22 DIAGNOSIS — N838 Other noninflammatory disorders of ovary, fallopian tube and broad ligament: Secondary | ICD-10-CM | POA: Diagnosis not present

## 2023-02-22 DIAGNOSIS — D271 Benign neoplasm of left ovary: Secondary | ICD-10-CM | POA: Diagnosis not present

## 2023-02-22 DIAGNOSIS — N946 Dysmenorrhea, unspecified: Secondary | ICD-10-CM | POA: Diagnosis not present

## 2023-02-22 DIAGNOSIS — E119 Type 2 diabetes mellitus without complications: Secondary | ICD-10-CM | POA: Diagnosis not present

## 2023-02-22 DIAGNOSIS — Z87891 Personal history of nicotine dependence: Secondary | ICD-10-CM | POA: Diagnosis not present

## 2023-02-22 DIAGNOSIS — Z881 Allergy status to other antibiotic agents status: Secondary | ICD-10-CM | POA: Diagnosis not present

## 2023-02-22 DIAGNOSIS — I10 Essential (primary) hypertension: Secondary | ICD-10-CM | POA: Diagnosis not present

## 2023-02-22 DIAGNOSIS — N8302 Follicular cyst of left ovary: Secondary | ICD-10-CM | POA: Diagnosis not present

## 2023-02-22 DIAGNOSIS — J45909 Unspecified asthma, uncomplicated: Secondary | ICD-10-CM | POA: Diagnosis not present

## 2023-02-22 DIAGNOSIS — N920 Excessive and frequent menstruation with regular cycle: Secondary | ICD-10-CM | POA: Diagnosis not present

## 2023-02-22 DIAGNOSIS — Z7982 Long term (current) use of aspirin: Secondary | ICD-10-CM | POA: Diagnosis not present

## 2023-02-22 DIAGNOSIS — Z8673 Personal history of transient ischemic attack (TIA), and cerebral infarction without residual deficits: Secondary | ICD-10-CM | POA: Diagnosis not present

## 2023-04-05 DIAGNOSIS — R1900 Intra-abdominal and pelvic swelling, mass and lump, unspecified site: Secondary | ICD-10-CM | POA: Diagnosis not present

## 2023-04-06 DIAGNOSIS — M5114 Intervertebral disc disorders with radiculopathy, thoracic region: Secondary | ICD-10-CM | POA: Diagnosis not present

## 2023-04-06 DIAGNOSIS — M4724 Other spondylosis with radiculopathy, thoracic region: Secondary | ICD-10-CM | POA: Diagnosis not present

## 2023-04-06 DIAGNOSIS — M4804 Spinal stenosis, thoracic region: Secondary | ICD-10-CM | POA: Diagnosis not present

## 2023-04-12 DIAGNOSIS — J301 Allergic rhinitis due to pollen: Secondary | ICD-10-CM | POA: Diagnosis not present

## 2023-04-12 DIAGNOSIS — J4520 Mild intermittent asthma, uncomplicated: Secondary | ICD-10-CM | POA: Diagnosis not present

## 2023-04-12 DIAGNOSIS — J3089 Other allergic rhinitis: Secondary | ICD-10-CM | POA: Diagnosis not present

## 2023-04-12 DIAGNOSIS — J3081 Allergic rhinitis due to animal (cat) (dog) hair and dander: Secondary | ICD-10-CM | POA: Diagnosis not present

## 2023-04-24 IMAGING — CT CT PELVIS W/O CM
1 series · 15 of 32 positions shown, 19 images · non-contrast
Comparison: None.

CLINICAL DATA: Right inguinal pain.  Prior hernia repair.

EXAM:
CT PELVIS WITHOUT CONTRAST
TECHNIQUE: Multidetector CT imaging of the pelvis was performed following the
standard protocol without intravenous contrast.
RADIATION DOSE REDUCTION: This exam was performed according to the
departmental dose-optimization program which includes automated
exposure control, adjustment of the mA and/or kV according to
patient size and/or use of iterative reconstruction technique.

[Series 2: routine pelvis w/(date) · axial · 0.92mm/px · z∈[-448,-168]mm · 15 of 64 slices shown, 19 images]
[im 5/64  soft-tissue]
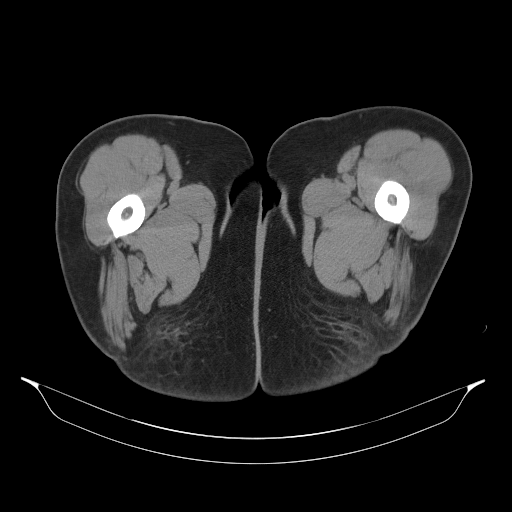
[im 5/64  bone]
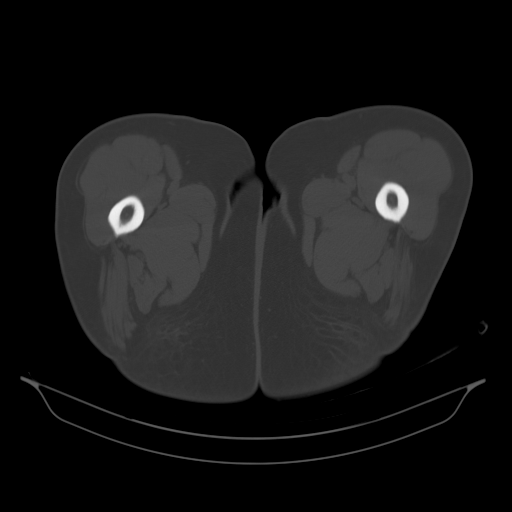
[im 9/64  soft-tissue]
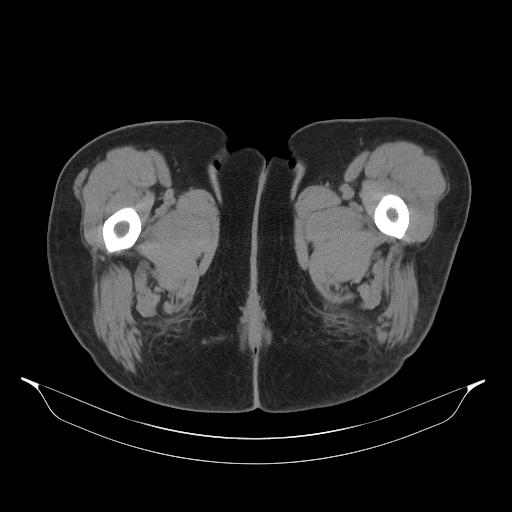
[im 13/64  soft-tissue]
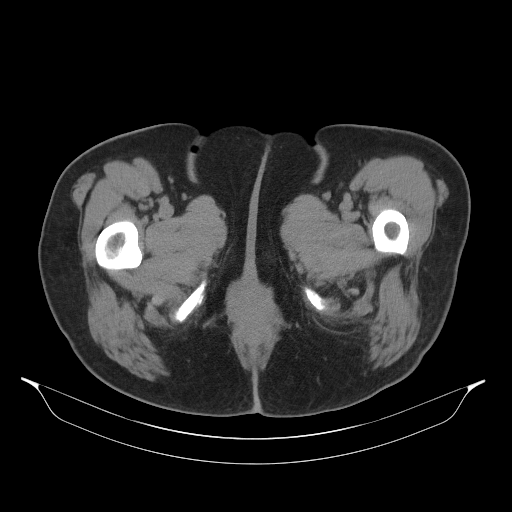
[im 19/64  soft-tissue]
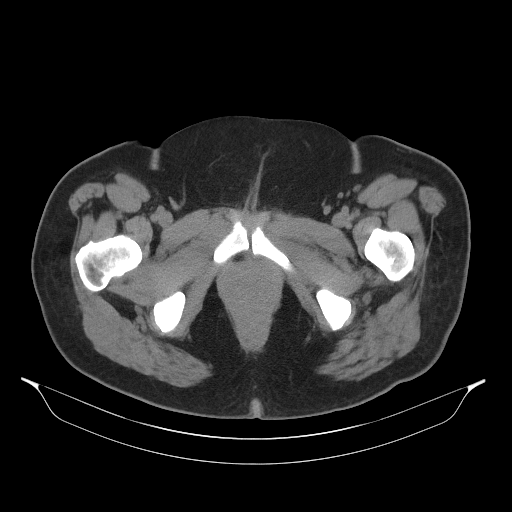
[im 23/64  soft-tissue]
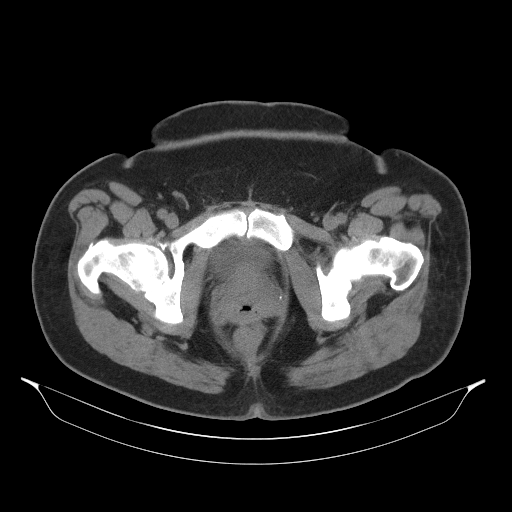
[im 27/64  soft-tissue]
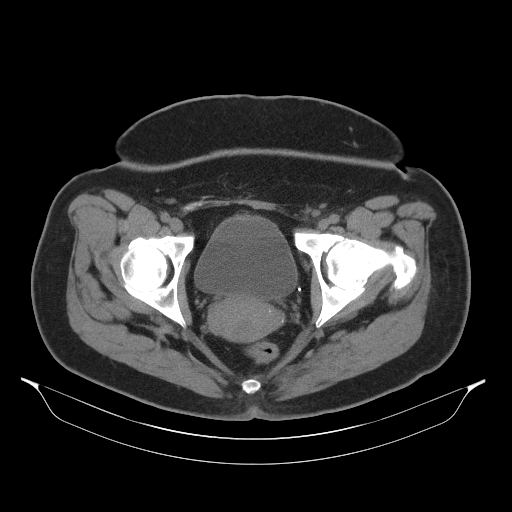
[im 33/64  soft-tissue]
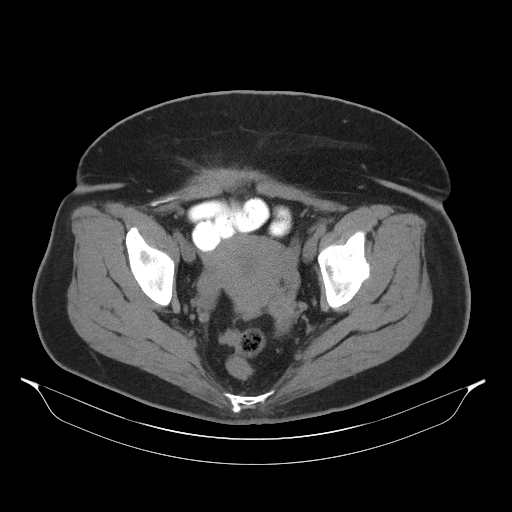
[im 37/64  soft-tissue]
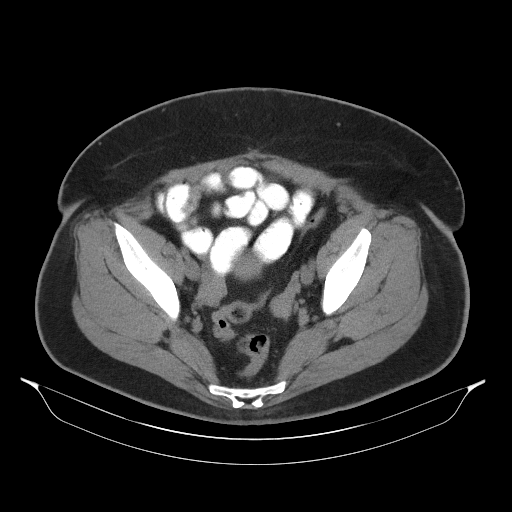
[im 41/64  soft-tissue]
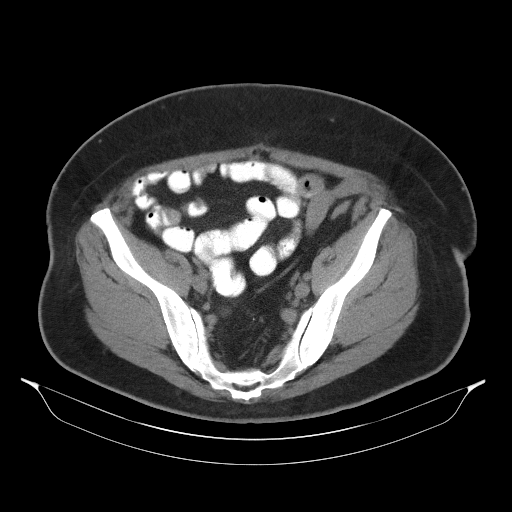
[im 41/64  bone]
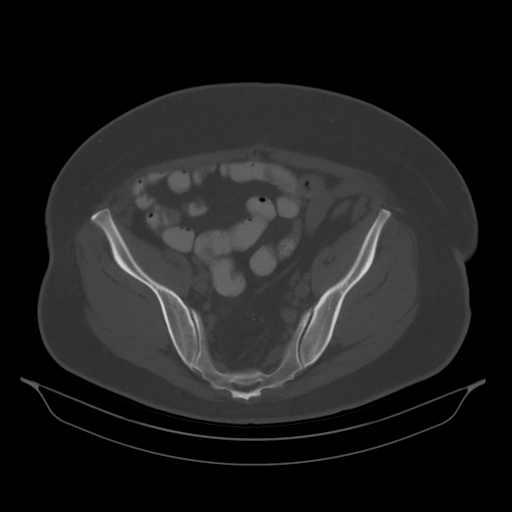
[im 45/64  soft-tissue]
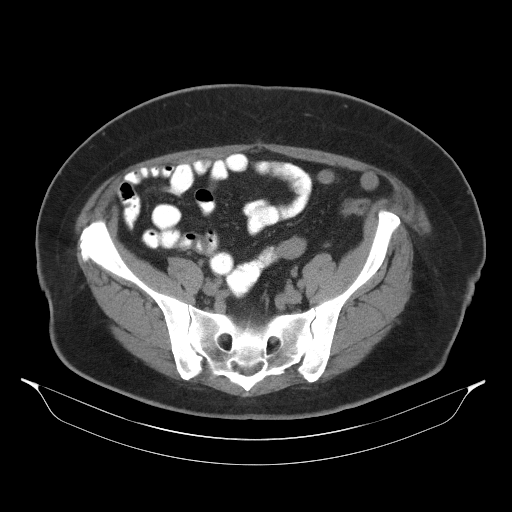
[im 51/64  soft-tissue]
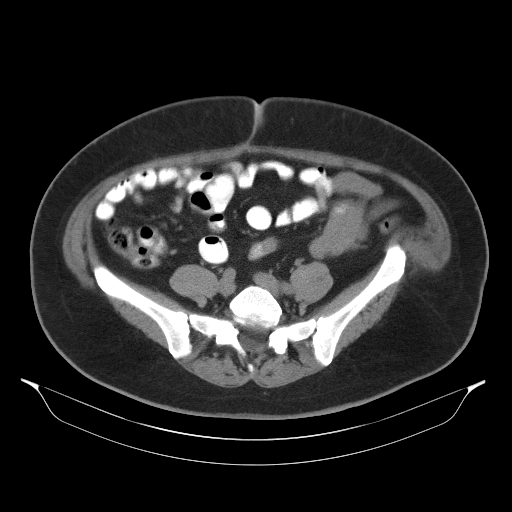
[im 55/64  soft-tissue]
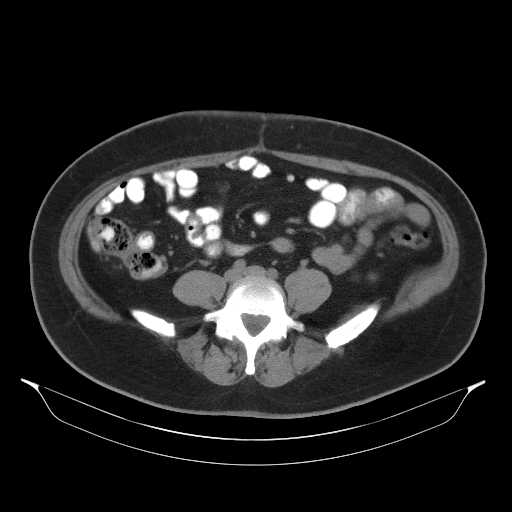
[im 55/64  lung]
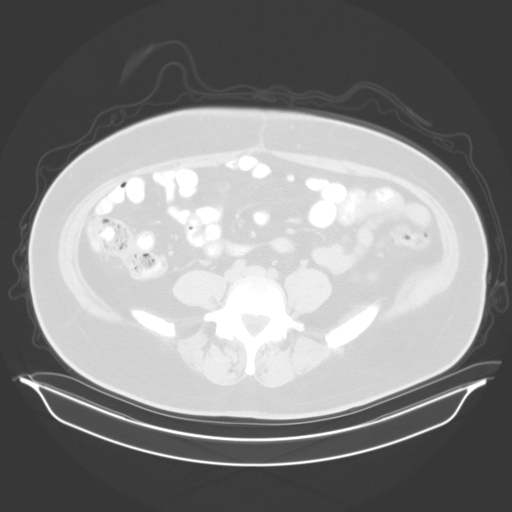
[im 57/64  lung]
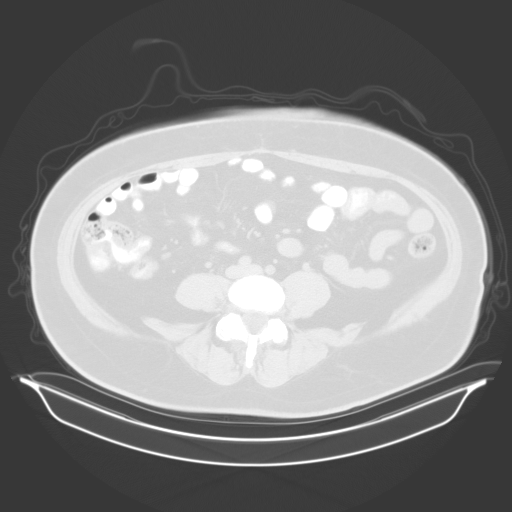
[im 59/64  soft-tissue]
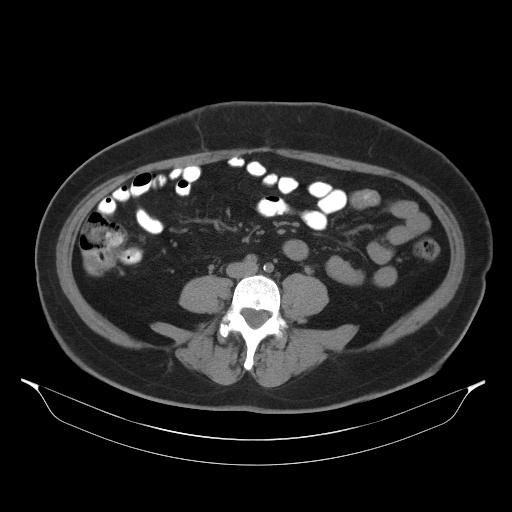
[im 59/64  lung]
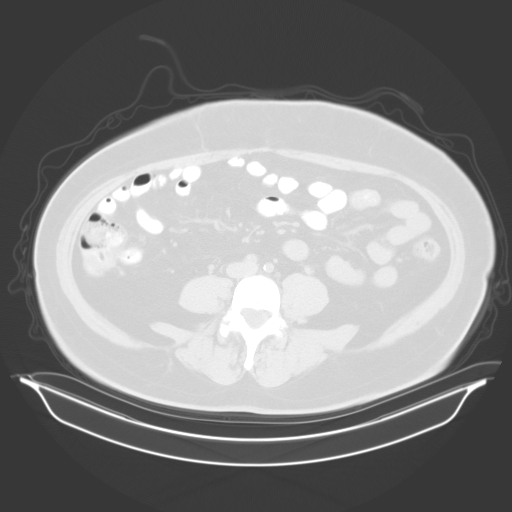
[im 61/64  lung]
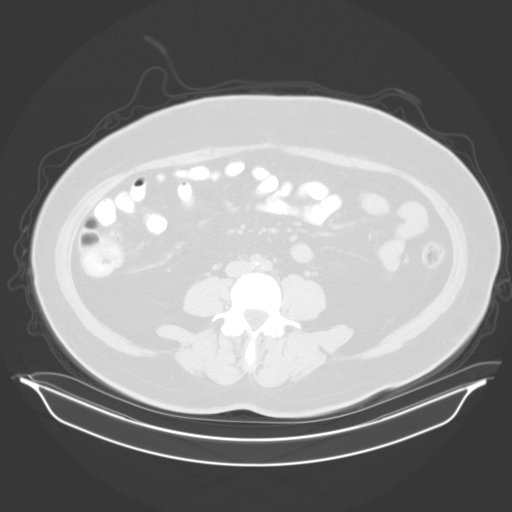

[15 of 32 positions shown; findings below may reference images not displayed]

FINDINGS: Evaluation of this exam is limited in the absence of intravenous
contrast.

Urinary Tract:  No abnormality visualized.

Bowel:  Unremarkable visualized pelvic bowel loops.

Vascular/Lymphatic: Mild atherosclerotic calcification. The
visualized iliac vessels appear grossly unremarkable on this
noncontrast CT. No pelvic adenopathy.

Reproductive: The uterus is anteverted and grossly unremarkable. No
adnexal masses.

Other: Prior right inguinal hernia repair. No evidence of hernia
recurrence. No fluid collection.

Musculoskeletal: No suspicious bone lesions identified.
IMPRESSION: 1. No acute pelvic pathology.  No recurrent hernia.
2. Aortic Atherosclerosis ([id number]).

## 2023-06-22 DIAGNOSIS — R2689 Other abnormalities of gait and mobility: Secondary | ICD-10-CM | POA: Diagnosis not present

## 2023-06-22 DIAGNOSIS — H9191 Unspecified hearing loss, right ear: Secondary | ICD-10-CM | POA: Diagnosis not present

## 2023-06-22 DIAGNOSIS — H938X1 Other specified disorders of right ear: Secondary | ICD-10-CM | POA: Diagnosis not present

## 2023-06-22 DIAGNOSIS — I10 Essential (primary) hypertension: Secondary | ICD-10-CM | POA: Diagnosis not present

## 2023-06-22 DIAGNOSIS — H60392 Other infective otitis externa, left ear: Secondary | ICD-10-CM | POA: Diagnosis not present
